# Patient Record
Sex: Female | Born: 1960 | Race: White | Hispanic: No | Marital: Married | Smoking: Current some day smoker
Health system: Southern US, Community
[De-identification: ages and names within clinical notes are randomized; demographics above are authoritative.]

## PROBLEM LIST (undated history)

## (undated) DIAGNOSIS — E079 Disorder of thyroid, unspecified: Secondary | ICD-10-CM

## (undated) DIAGNOSIS — K219 Gastro-esophageal reflux disease without esophagitis: Secondary | ICD-10-CM

## (undated) DIAGNOSIS — E039 Hypothyroidism, unspecified: Secondary | ICD-10-CM

## (undated) DIAGNOSIS — R011 Cardiac murmur, unspecified: Secondary | ICD-10-CM

## (undated) DIAGNOSIS — R519 Headache, unspecified: Secondary | ICD-10-CM

## (undated) DIAGNOSIS — M810 Age-related osteoporosis without current pathological fracture: Secondary | ICD-10-CM

## (undated) DIAGNOSIS — K5790 Diverticulosis of intestine, part unspecified, without perforation or abscess without bleeding: Secondary | ICD-10-CM

## (undated) DIAGNOSIS — Z5189 Encounter for other specified aftercare: Secondary | ICD-10-CM

## (undated) DIAGNOSIS — R51 Headache: Secondary | ICD-10-CM

## (undated) HISTORY — PX: BREAST SURGERY: SHX581

## (undated) HISTORY — DX: Diverticulosis of intestine, part unspecified, without perforation or abscess without bleeding: K57.90

## (undated) HISTORY — DX: Age-related osteoporosis without current pathological fracture: M81.0

## (undated) HISTORY — DX: Disorder of thyroid, unspecified: E07.9

## (undated) HISTORY — PX: COLON SURGERY: SHX602

## (undated) HISTORY — PX: ABDOMINAL HYSTERECTOMY: SHX81

## (undated) HISTORY — DX: Encounter for other specified aftercare: Z51.89

## (undated) HISTORY — PX: SMALL INTESTINE SURGERY: SHX150

## (undated) HISTORY — PX: HERNIA REPAIR: SHX51

---

## 2010-05-25 HISTORY — PX: COLONOSCOPY: SHX174

## 2011-03-16 ENCOUNTER — Ambulatory Visit: Payer: Self-pay | Admitting: Internal Medicine

## 2012-04-01 ENCOUNTER — Ambulatory Visit (INDEPENDENT_AMBULATORY_CARE_PROVIDER_SITE_OTHER): Payer: BC Managed Care – PPO | Admitting: Obstetrics & Gynecology

## 2012-04-01 ENCOUNTER — Encounter: Payer: Self-pay | Admitting: Obstetrics & Gynecology

## 2012-04-01 VITALS — BP 135/76 | HR 87 | Ht 64.0 in | Wt 174.0 lb

## 2012-04-01 DIAGNOSIS — Z1151 Encounter for screening for human papillomavirus (HPV): Secondary | ICD-10-CM

## 2012-04-01 DIAGNOSIS — Z0000 Encounter for general adult medical examination without abnormal findings: Secondary | ICD-10-CM

## 2012-04-01 DIAGNOSIS — Z124 Encounter for screening for malignant neoplasm of cervix: Secondary | ICD-10-CM

## 2012-04-01 DIAGNOSIS — Z01419 Encounter for gynecological examination (general) (routine) without abnormal findings: Secondary | ICD-10-CM

## 2012-04-01 NOTE — Progress Notes (Signed)
Subjective:    [name] is a 51 y.o. female who presents for an annual exam. The patient has no complaints today. She does report rare sex due to dyspareunia related to vaginal atrophy. The patient is sexually active. GYN screening history: last pap: was normal. The patient wears seatbelts: yes. The patient participates in regular exercise: no. Has the patient ever been transfused or tattooed?: no. The patient reports that there is not domestic violence in her life.   Menstrual History: OB History    Grav Para Term Preterm Abortions TAB SAB Ect Mult Living   2 2 2       2       Menarche age: 89 No LMP recorded. Patient is postmenopausal.    The following portions of the patient's history were reviewed and updated as appropriate: allergies, current medications, past family history, past medical history, past social history, past surgical history and problem list.  Review of Systems A comprehensive review of systems was negative. She has been married for 31 years, menopausal since 51 yo, has her mammogram next week, colonoscopy is UTD, she refuses a flu shot today (ever)   Objective:    BP 135/76  Pulse 87  Ht 5\' 4"  (1.626 m)  Wt 174 lb (78.926 kg)  BMI 29.87 kg/m2  General Appearance:    Alert, cooperative, no distress, appears stated age  Head:    Normocephalic, without obvious abnormality, atraumatic  Eyes:    PERRL, conjunctiva/corneas clear, EOM's intact, fundi    benign, both eyes  Ears:    Normal TM's and external ear canals, both ears  Nose:   Nares normal, septum midline, mucosa normal, no drainage    or sinus tenderness  Throat:   Lips, mucosa, and tongue normal; teeth and gums normal  Neck:   Supple, symmetrical, trachea midline, no adenopathy;    thyroid:  no enlargement/tenderness/nodules; no carotid   bruit or JVD  Back:     Symmetric, no curvature, ROM normal, no CVA tenderness  Lungs:     Clear to auscultation bilaterally, respirations unlabored  Chest  Wall:    No tenderness or deformity   Heart:    Regular rate and rhythm, S1 and S2 normal, no murmur, rub   or gallop  Breast Exam:    No tenderness, masses, or nipple abnormality  Abdomen:     Soft, non-tender, bowel sounds active all four quadrants,    no masses, no organomegaly  Genitalia:    Normal female without lesion, discharge or tenderness, severe atrophy, 2nd degree cystocele and uterine prolapse, NSSA, NT, mobile, no adnexal masses or tenderness     Extremities:   Extremities normal, atraumatic, no cyanosis or edema  Pulses:   2+ and symmetric all extremities  Skin:   Skin color, texture, turgor normal, no rashes or lesions  Lymph nodes:   Cervical, supraclavicular, and axillary nodes normal  Neurologic:   CNII-XII intact, normal strength, sensation and reflexes    throughout  .    Assessment:    Healthy female exam.    Plan:     Breast self exam technique reviewed and patient encouraged to perform self-exam monthly. Thin prep Pap smear.

## 2012-04-16 ENCOUNTER — Encounter: Payer: Self-pay | Admitting: Obstetrics & Gynecology

## 2012-07-29 DIAGNOSIS — R351 Nocturia: Secondary | ICD-10-CM | POA: Insufficient documentation

## 2012-07-29 DIAGNOSIS — Z87440 Personal history of urinary (tract) infections: Secondary | ICD-10-CM | POA: Insufficient documentation

## 2012-08-19 DIAGNOSIS — R3129 Other microscopic hematuria: Secondary | ICD-10-CM | POA: Insufficient documentation

## 2014-04-19 ENCOUNTER — Encounter: Payer: Self-pay | Admitting: Obstetrics & Gynecology

## 2016-02-16 DIAGNOSIS — S86912A Strain of unspecified muscle(s) and tendon(s) at lower leg level, left leg, initial encounter: Secondary | ICD-10-CM | POA: Insufficient documentation

## 2016-02-16 DIAGNOSIS — M222X2 Patellofemoral disorders, left knee: Secondary | ICD-10-CM | POA: Insufficient documentation

## 2016-02-24 DIAGNOSIS — M2550 Pain in unspecified joint: Secondary | ICD-10-CM | POA: Insufficient documentation

## 2016-02-24 DIAGNOSIS — R519 Headache, unspecified: Secondary | ICD-10-CM | POA: Insufficient documentation

## 2016-02-24 DIAGNOSIS — R51 Headache: Secondary | ICD-10-CM

## 2016-02-24 DIAGNOSIS — E060 Acute thyroiditis: Secondary | ICD-10-CM | POA: Insufficient documentation

## 2016-02-24 DIAGNOSIS — K219 Gastro-esophageal reflux disease without esophagitis: Secondary | ICD-10-CM | POA: Insufficient documentation

## 2016-02-24 DIAGNOSIS — L409 Psoriasis, unspecified: Secondary | ICD-10-CM | POA: Insufficient documentation

## 2016-08-07 ENCOUNTER — Telehealth: Payer: Self-pay | Admitting: Gastroenterology

## 2016-08-07 NOTE — Telephone Encounter (Signed)
Left voice message for patient to call and schedule appointment with GI for probable abscess formation at the rectosigmoid junction, difficulty passing bowel movements

## 2016-08-27 ENCOUNTER — Ambulatory Visit (INDEPENDENT_AMBULATORY_CARE_PROVIDER_SITE_OTHER): Payer: BC Managed Care – PPO | Admitting: Gastroenterology

## 2016-08-27 ENCOUNTER — Encounter: Payer: Self-pay | Admitting: Gastroenterology

## 2016-08-27 VITALS — BP 128/72 | Ht 64.0 in | Wt 149.4 lb

## 2016-08-27 DIAGNOSIS — E039 Hypothyroidism, unspecified: Secondary | ICD-10-CM | POA: Insufficient documentation

## 2016-08-27 DIAGNOSIS — R194 Change in bowel habit: Secondary | ICD-10-CM

## 2016-08-27 DIAGNOSIS — M810 Age-related osteoporosis without current pathological fracture: Secondary | ICD-10-CM | POA: Insufficient documentation

## 2016-08-27 NOTE — Progress Notes (Signed)
Gastroenterology Consultation  Referring Provider:     No ref. provider found Primary Care Physician:  Imelda Pillow, NP Primary Gastroenterologist:  Dr. Wyline Mood  Reason for Consultation:     "rectal abscess"        HPI:   [name] is a 56 y.o. y/o female referred for consultation & management  by Dr. Imelda Pillow, NP.    She says she had an CT scan of the abdomen at [facility] on 07/30/16. She says developed left lower abdominal pain  In 04/2015 , subsequently she had watery , yellowish stool, not fully formed, nausea, throw up. She says since 2016 lost 20 lbs. She says that she was initially told she has diverticulitis. She has a history of recurrent UTI's. Right before x mass, had pain during defecation . Says at that time she had a "lot of pressure in her rectum", unable to go ,was taking Amitiza for constipation which she took as needed.  Subsequently she had a CT scan .   Presently she says that the last 1 week has "normal bowel movements" , every day which is odd for her. Does not have the "pressure", When she has a meal has the "pressure".   Her last colonoscopy was 6 years back and recalls she had diverticula. Otherwise she recalls was "normal". No family history of colon cancer.   Denies any fevers. Last course of antibiotics "been a while"    BP 128/72   Ht 5\' 4"  (1.626 m)   Wt 149 lb 6.4 oz (67.8 kg)   BMI 25.64 kg/m    Past Medical History:  Diagnosis Date  . Osteoporosis    borderline  . Thyroid disease    hypothyriod    Past Surgical History:  Procedure Laterality Date  . BREAST SURGERY     sterotatic biopsy on right breast.    Prior to Admission medications   Medication Sig Start Date End Date Taking? Authorizing Provider  ARMOUR THYROID 30 MG tablet  03/12/12   Historical Provider, MD  Cholecalciferol (VITAMIN D3) 2000 UNITS TABS Take 2,000 Units by mouth 2 (two) times daily.    Historical Provider, MD  clobetasol ointment  (TEMOVATE) 0.05 %  02/21/12   Historical Provider, MD    Family History  Problem Relation Age of Onset  . Heart disease Father     congestive heart failure  . Hypothyroidism Sister   . Arthritis Sister     RA  . Diabetes Maternal Grandmother   . Hypothyroidism Maternal Grandmother   . Alzheimer's disease Paternal Grandmother   . Hypothyroidism Sister      Social History  Substance Use Topics  . Smoking status: Current Every Day Smoker    Types: Cigarettes  . Smokeless tobacco: Not on file     Comment: patient not thinking about it .  Marland Kitchen Alcohol use No    Allergies as of 08/27/2016  . (No Known Allergies)    Review of Systems:    All systems reviewed and negative except where noted in HPI.   Physical Exam:  There were no vitals taken for this visit. No LMP recorded. Patient is postmenopausal. Psych:  Alert and cooperative. Normal mood and affect. General:   Alert,  Well-developed, well-nourished, pleasant and cooperative in NAD Head:  Normocephalic and atraumatic. Eyes:  Sclera clear, no icterus.   Conjunctiva pink. Ears:  Normal auditory acuity. Nose:  No deformity, discharge, or lesions. Mouth:  No deformity or lesions,oropharynx pink &  moist. Neck:  Supple; no masses or thyromegaly. Lungs:  Respirations even and unlabored.  Clear throughout to auscultation.   No wheezes, crackles, or rhonchi. No acute distress. Heart:  Regular rate and rhythm; no murmurs, clicks, rubs, or gallops. Abdomen:  Normal bowel sounds.  No bruits.  Soft, non-tender and non-distended without masses, hepatosplenomegaly or hernias noted.  No guarding or rebound tenderness.    Msk:  Symmetrical without gross deformities. Good, equal movement & strength bilaterally. Extremities:  No clubbing or edema.  No cyanosis. Neurologic:  Alert and oriented x3;  grossly normal neurologically. Psych:  Alert and cooperative. Normal mood and affect.  Imaging Studies: No results found.  Assessment and Plan:     [name] is a 56 y.o. y/o female has been referred for "rectal abscess". I do not have the results of her Ct scan , by her description likely a diverticular abscess. Also recent change in bowel habits.    Plan   1. Please obtain results of recent CT scan , based on results of CT scan will time her colonoscopy to evaluate change in bowel habits.  2. Depending on timing  3. Advised to stop smoking .   Follow up in 8-10 weeks   Dr Wyline MoodKiran Mckinley Olheiser MD

## 2016-09-04 ENCOUNTER — Other Ambulatory Visit: Payer: Self-pay

## 2016-09-04 DIAGNOSIS — R1032 Left lower quadrant pain: Secondary | ICD-10-CM

## 2016-09-12 ENCOUNTER — Telehealth: Payer: Self-pay | Admitting: Gastroenterology

## 2016-09-12 NOTE — Telephone Encounter (Signed)
Patient called wanting results on abd ct.

## 2016-09-13 ENCOUNTER — Telehealth: Payer: Self-pay

## 2016-09-13 ENCOUNTER — Other Ambulatory Visit: Payer: Self-pay

## 2016-09-13 DIAGNOSIS — Z1211 Encounter for screening for malignant neoplasm of colon: Secondary | ICD-10-CM

## 2016-09-13 NOTE — Telephone Encounter (Signed)
LVM for patient callback.   Received results from [facility].   Waiting for Dr. Johnney KillianAnna's response.

## 2016-09-13 NOTE — Telephone Encounter (Signed)
Gastroenterology Pre-Procedure Review  Request Date: 09/25/16 Requesting Physician: Dr. Tobi BastosAnna  PATIENT REVIEW QUESTIONS: The patient responded to the following health history questions as indicated:    1. Are you having any GI issues? no 2. Do you have a personal history of Polyps? nO 3. Do you have a family history of Colon Cancer or Polyps? no 4. Diabetes Mellitus? no 5. Joint replacements in the past 12 months?no 6. Major health problems in the past 3 months?no 7. Any artificial heart valves, MVP, or defibrillator?no    MEDICATIONS & ALLERGIES:    Patient reports the following regarding taking any anticoagulation/antiplatelet therapy:   Plavix, Coumadin, Eliquis, Xarelto, Lovenox, Pradaxa, Brilinta, or Effient? no Aspirin? no  Patient confirms/reports the following medications:  Current Outpatient Prescriptions  Medication Sig Dispense Refill  . alendronate (FOSAMAX) 70 MG tablet Take by mouth.    Mack Guise. ARMOUR THYROID 30 MG tablet     . Cholecalciferol (VITAMIN D3) 2000 UNITS TABS Take 2,000 Units by mouth 2 (two) times daily.    . clobetasol ointment (TEMOVATE) 0.05 %     . conjugated estrogens (PREMARIN) vaginal cream Apply pea-sized amount to external vaginal tissues 2-3 times weekly.    Marland Kitchen. lubiprostone (AMITIZA) 8 MCG capsule Take by mouth.    . Omega-3 Fatty Acids (FISH OIL PO) Take by mouth.    Marland Kitchen. omeprazole (PRILOSEC OTC) 20 MG tablet Take by mouth.     No current facility-administered medications for this visit.     Patient confirms/reports the following allergies:  No Known Allergies  No orders of the defined types were placed in this encounter.   AUTHORIZATION INFORMATION Primary Insurance: 1D#: Group #:  Secondary Insurance: 1D#: Group #:  SCHEDULE INFORMATION: Date: 09/25/16 Time: Location: [facility]

## 2016-09-19 ENCOUNTER — Telehealth: Payer: Self-pay | Admitting: Gastroenterology

## 2016-09-19 NOTE — Telephone Encounter (Signed)
Patient called to cancel her colonoscopy on 4/10. I called [facility] to cancel. Patient will call back and she stated she just couldn't come at this time

## 2016-09-25 ENCOUNTER — Ambulatory Visit
Admission: RE | Admit: 2016-09-25 | Payer: BC Managed Care – PPO | Source: Ambulatory Visit | Admitting: Gastroenterology

## 2016-09-25 ENCOUNTER — Encounter: Admission: RE | Payer: Self-pay | Source: Ambulatory Visit

## 2016-09-25 SURGERY — COLONOSCOPY WITH PROPOFOL
Anesthesia: General

## 2016-10-01 ENCOUNTER — Encounter: Payer: Self-pay | Admitting: *Deleted

## 2016-10-02 ENCOUNTER — Ambulatory Visit (INDEPENDENT_AMBULATORY_CARE_PROVIDER_SITE_OTHER): Payer: BC Managed Care – PPO | Admitting: General Surgery

## 2016-10-02 ENCOUNTER — Encounter: Payer: Self-pay | Admitting: General Surgery

## 2016-10-02 VITALS — BP 134/84 | HR 70 | Resp 12 | Ht 64.0 in | Wt 142.0 lb

## 2016-10-02 DIAGNOSIS — R634 Abnormal weight loss: Secondary | ICD-10-CM | POA: Insufficient documentation

## 2016-10-02 DIAGNOSIS — Z8719 Personal history of other diseases of the digestive system: Secondary | ICD-10-CM | POA: Insufficient documentation

## 2016-10-02 DIAGNOSIS — N739 Female pelvic inflammatory disease, unspecified: Secondary | ICD-10-CM | POA: Diagnosis not present

## 2016-10-02 LAB — POC HEMOCCULT BLD/STL (OFFICE/1-CARD/DIAGNOSTIC)
Card #1 Date: NEGATIVE
Fecal Occult Blood, POC: NEGATIVE

## 2016-10-02 NOTE — Patient Instructions (Addendum)
The patient is aware to call back for any questions or concerns.  office rigid sigmoidoscopy with fleets enema prior.

## 2016-10-02 NOTE — Progress Notes (Addendum)
Patient ID: [name], female   DOB: 1960-12-25, 56 y.o.   MRN: [id number]  Chief Complaint  Patient presents with  . Other    possible abscess    HPI [name] is a 56 y.o. female.  Here today for evaluation of a possible abscess. She states that 2 years ago(Oct 2016) she started having left lower abdomen pain and was having mucous watery diarrhea. Lasting 3-4 days. She states that by the end of the day the stool would change from mucous to a little blood tinged yellow stool. These episodes would come and go and be associated with a UTI. She states that since December it flared up and has not went away. When she would have the pain she would be nauseated and vomit on occasion. Admits to chills but no fevers. Not associated with any particular foods. Last UTI was October. She has not had a normal BM since December. She never feels like she completely empties her bowels with pressure. She did see Dr Tobi Bastos. She did not have a colonoscopy due to "fear" that the abscess might rupture. Marland Kitchen Her weight in October 2016 was 177 today weight 142. She is here with her husband, [name].   HPI  Past Medical History:  Diagnosis Date  . Diverticulosis   . Osteoporosis    borderline  . Thyroid disease    hypothyriod    Past Surgical History:  Procedure Laterality Date  . BREAST SURGERY     sterotatic biopsy on right breast.    Family History  Problem Relation Age of Onset  . Heart disease Father     congestive heart failure  . Hypothyroidism Sister   . Arthritis Sister     RA  . Diabetes Maternal Grandmother   . Hypothyroidism Maternal Grandmother   . Alzheimer's disease Paternal Grandmother   . Hypothyroidism Sister     Social History Social History  Substance Use Topics  . Smoking status: Current Every Day Smoker    Packs/day: 0.25    Types: Cigarettes  . Smokeless tobacco: Never Used     Comment: patient not thinking about it .  Marland Kitchen Alcohol use No    No  Known Allergies  Current Outpatient Prescriptions  Medication Sig Dispense Refill  . alendronate (FOSAMAX) 70 MG tablet Take by mouth.    Mack Guise THYROID 30 MG tablet     . Calcium Carbonate-Vit D-Min (CALCIUM 1200 PO) Take by mouth daily.    . Cholecalciferol (VITAMIN D3) 2000 UNITS TABS Take 2,000 Units by mouth 2 (two) times daily.    . clobetasol ointment (TEMOVATE) 0.05 % Apply 1 application topically as needed.     . lubiprostone (AMITIZA) 8 MCG capsule Take by mouth.    . Omega-3 Fatty Acids (FISH OIL PO) Take by mouth.    Marland Kitchen omeprazole (PRILOSEC OTC) 20 MG tablet Take by mouth as needed.     . sucralfate (CARAFATE) 1 g tablet Take 1 g by mouth as needed.    . vitamin E 100 UNIT capsule Take 100 Units by mouth daily.     No current facility-administered medications for this visit.     Review of Systems Review of Systems  Constitutional: Positive for chills.  Respiratory: Negative.   Cardiovascular: Negative.   Gastrointestinal: Positive for abdominal pain, diarrhea, nausea and vomiting.    Blood pressure 134/84, pulse 70, resp. rate 12, height  (1.626 m), weight 142 lb (64.4 kg).  Physical Exam Physical  Exam  Constitutional: She is oriented to person, place, and time. She appears well-developed and well-nourished.  HENT:  Mouth/Throat: Oropharynx is clear and moist.  Eyes: Conjunctivae are normal. No scleral icterus.  Neck: Neck supple.  Cardiovascular: Normal rate, regular rhythm and normal heart sounds.   Pulses:      Femoral pulses are 2+ on the right side, and 2+ on the left side.      Dorsalis pedis pulses are 2+ on the right side, and 2+ on the left side.       Posterior tibial pulses are 2+ on the right side, and 2+ on the left side.  No lower leg edema  Pulmonary/Chest: Effort normal and breath sounds normal.  Abdominal: Soft. Normal appearance and bowel sounds are normal. There is no tenderness.  Genitourinary: Rectal exam shows mass. Rectal exam shows  guaiac negative stool.  Genitourinary Comments: On the anterior rectal wall and about 6 cm there is a firm mass without fluctuance noted to palpation. There is tenderness in this area. No posterior tenderness. Normal sphincter tone.  Lymphadenopathy:    She has no cervical adenopathy.  Neurological: She is alert and oriented to person, place, and time.  Skin: Skin is warm and dry.  Psychiatric: Her behavior is normal.    Data Reviewed CT scan of the abdomen and pelvis dated 07/30/2016 completed at [facility] was personally reviewed. Normal appendix. No evidence of inflammatory bowel disease. Likely developing abscess anterior to the rectosigmoid junction measuring up to 2.2 cm. No evidence of acute diverticulitis. This study was completed with both IV and oral contrast. A hyperemic rim around the anterior rectal wall abscess is noted.  CT scan of the abdomen and pelvis completed with oral contrast only on 09/06/2016 at [facility] in [place] was personally reviewed. Shotty mesenteric nodes. No acute process noted. Mild diverticulosis. Fecal retention. No area of definite abscess was noted on this study completed without IV contrast.  CT scan of the abdomen and pelvis completed at [facility] dated 09/27/2016 was personally reviewed. The previously noted cul-de-sac abscess in the anterior pararectal lesion has increased from 2.2cm to 4.8 cm in diameter.  GI consultation of August 27, 2016 completed by Wyline Mood, M.D reviewed. Benign exam. Colonoscopy recommended. Repeat CT scan arranged for 09/06/2016.  Previous laboratory studies not available for review at this time.  Assessment    Enlarging anterior rectal abscess. Abnormal rectal exam. Significant weight loss. Blood/mucus in the stools by history.    Plan    At her age the most common process would be a diverticular abscess although its unusual be identified in the cul-de-sac without more  pronounced abdominal symptoms.  Other concerns could be a primary rectal pathology with focal perforation.  The patient certainly has an enlarging abscess and she will likely benefit from CT-guided drainage.  Arrangements have been made for a sigmoidoscopy tomorrow to assess the rectal mucosa for the possibility of a primary rectal malignancy. If this is negative, arrangements were made for observation, CT-guided drainage with overnight observation.     Guaiac negative today. Recommend office rigid sigmoidoscopy with fleets enema prior. Then possible CT drainage abscess.   HPI, Physical Exam, Assessment and Plan have been scribed under the direction and in the presence of [name], MD.  Dorathy Daft, RN  I have completed the exam and reviewed the above documentation for accuracy and completeness.  I agree with the above.  Museum/gallery conservator has been used  and any errors in dictation or transcription are unintentional.  Donnalee Curry, M.D., F.A.C.S.   [name] 10/02/2016, 9:27 PM

## 2016-10-03 ENCOUNTER — Encounter: Payer: Self-pay | Admitting: General Surgery

## 2016-10-03 ENCOUNTER — Other Ambulatory Visit: Payer: Self-pay | Admitting: General Surgery

## 2016-10-03 ENCOUNTER — Ambulatory Visit (INDEPENDENT_AMBULATORY_CARE_PROVIDER_SITE_OTHER): Payer: BC Managed Care – PPO | Admitting: General Surgery

## 2016-10-03 ENCOUNTER — Ambulatory Visit
Admission: RE | Admit: 2016-10-03 | Discharge: 2016-10-03 | Disposition: A | Discharge status: Home / Self Care | Payer: Self-pay | Source: Ambulatory Visit | Attending: General Surgery | Admitting: General Surgery

## 2016-10-03 ENCOUNTER — Other Ambulatory Visit (HOSPITAL_COMMUNITY): Payer: Self-pay | Admitting: Occupational Therapy

## 2016-10-03 VITALS — BP 130/76 | HR 80 | Resp 12 | Ht 64.0 in | Wt 141.0 lb

## 2016-10-03 DIAGNOSIS — K5780 Diverticulitis of intestine, part unspecified, with perforation and abscess without bleeding: Secondary | ICD-10-CM | POA: Diagnosis not present

## 2016-10-03 DIAGNOSIS — K5792 Diverticulitis of intestine, part unspecified, without perforation or abscess without bleeding: Secondary | ICD-10-CM

## 2016-10-03 DIAGNOSIS — N739 Female pelvic inflammatory disease, unspecified: Secondary | ICD-10-CM

## 2016-10-03 MED ORDER — ERTAPENEM SODIUM 1 G IJ SOLR: 1.0000 g | Freq: Once | INTRAMUSCULAR | Status: DC

## 2016-10-03 NOTE — Progress Notes (Signed)
Patient ID: [name], female   DOB: 08-May-1961, 56 y.o.   MRN: [id number]  Chief Complaint  Patient presents with  . Procedure    rigid sigmoid    HPI [name] is a 56 y.o. female here today for a rigid sigmoid.  HPI  Past Medical History:  Diagnosis Date  . Diverticulosis   . Osteoporosis    borderline  . Thyroid disease    hypothyriod    Past Surgical History:  Procedure Laterality Date  . BREAST SURGERY     sterotatic biopsy on right breast.    Family History  Problem Relation Age of Onset  . Heart disease Father     congestive heart failure  . Hypothyroidism Sister   . Arthritis Sister     RA  . Diabetes Maternal Grandmother   . Hypothyroidism Maternal Grandmother   . Alzheimer's disease Paternal Grandmother   . Hypothyroidism Sister     Social History Social History  Substance Use Topics  . Smoking status: Current Every Day Smoker    Packs/day: 0.25    Types: Cigarettes  . Smokeless tobacco: Never Used     Comment: patient not thinking about it .  Marland Kitchen Alcohol use No    No Known Allergies  Current Outpatient Prescriptions  Medication Sig Dispense Refill  . alendronate (FOSAMAX) 70 MG tablet Take by mouth.    Mack Guise THYROID 30 MG tablet     . Calcium Carbonate-Vit D-Min (CALCIUM 1200 PO) Take by mouth daily.    . Cholecalciferol (VITAMIN D3) 2000 UNITS TABS Take 2,000 Units by mouth 2 (two) times daily.    . clobetasol ointment (TEMOVATE) 0.05 % Apply 1 application topically as needed.     . lubiprostone (AMITIZA) 8 MCG capsule Take by mouth.    . Omega-3 Fatty Acids (FISH OIL PO) Take by mouth.    Marland Kitchen omeprazole (PRILOSEC OTC) 20 MG tablet Take by mouth as needed.     . sucralfate (CARAFATE) 1 g tablet Take 1 g by mouth as needed.    . vitamin E 100 UNIT capsule Take 100 Units by mouth daily.     No current facility-administered medications for this visit.     Review of Systems Review of Systems  Constitutional: Negative.    Respiratory: Negative.   Cardiovascular: Negative.     Blood pressure 130/76, pulse 80, resp. rate 12, height  (1.626 m), weight 141 lb (64 kg).  Physical Exam Physical Exam Digital rectal exam was repeated and again showed granular texture at about 7 cm on the anterior rectal wall.  Data Reviewed Rigid sigmoidoscopy was completed to 12 cm. The mucosa is normal without evidence of malignancy.  Assessment    Low pelvic abscess, likely diverticular source.    Plan    Arrangements are in place for direct admission tomorrow for planned interventional radiology drainage of the abscess. The procedure has been reviewed with the patient and her husband. We'll plan for overnight observation for antibiotic administrations anticipating a transient bacteremia after abscess drainage.      I have completed the exam and reviewed the above documentation for accuracy and completeness.  I agree with the above.  Museum/gallery conservator has been used and any errors in dictation or transcription are unintentional.  Donnalee Curry, M.D., F.A.C.S.   [name] 10/03/2016, 3:54 PM

## 2016-10-04 ENCOUNTER — Observation Stay: Payer: BC Managed Care – PPO

## 2016-10-04 ENCOUNTER — Encounter: Payer: Self-pay | Admitting: *Deleted

## 2016-10-04 ENCOUNTER — Observation Stay
Admission: AD | Admit: 2016-10-04 | Discharge: 2016-10-04 | Disposition: A | Discharge status: Home / Self Care | Payer: BC Managed Care – PPO | Source: Ambulatory Visit | Attending: General Surgery | Admitting: General Surgery

## 2016-10-04 DIAGNOSIS — E039 Hypothyroidism, unspecified: Secondary | ICD-10-CM | POA: Diagnosis not present

## 2016-10-04 DIAGNOSIS — F17210 Nicotine dependence, cigarettes, uncomplicated: Secondary | ICD-10-CM | POA: Insufficient documentation

## 2016-10-04 DIAGNOSIS — N739 Female pelvic inflammatory disease, unspecified: Principal | ICD-10-CM | POA: Insufficient documentation

## 2016-10-04 DIAGNOSIS — Z7983 Long term (current) use of bisphosphonates: Secondary | ICD-10-CM | POA: Diagnosis not present

## 2016-10-04 DIAGNOSIS — Z79899 Other long term (current) drug therapy: Secondary | ICD-10-CM | POA: Insufficient documentation

## 2016-10-04 DIAGNOSIS — Z539 Procedure and treatment not carried out, unspecified reason: Secondary | ICD-10-CM | POA: Insufficient documentation

## 2016-10-04 DIAGNOSIS — L0291 Cutaneous abscess, unspecified: Secondary | ICD-10-CM

## 2016-10-04 LAB — APTT: aPTT: 37 seconds — ABNORMAL HIGH (ref 24–36)

## 2016-10-04 LAB — CBC
HCT: 37.2 % (ref 35.0–47.0)
Hemoglobin: 12.7 g/dL (ref 12.0–16.0)
MCH: 29.1 pg (ref 26.0–34.0)
MCHC: 34.1 g/dL (ref 32.0–36.0)
MCV: 85.2 fL (ref 80.0–100.0)
PLATELETS: 424 10*3/uL (ref 150–440)
RBC: 4.37 MIL/uL (ref 3.80–5.20)
RDW: 14.9 % — ABNORMAL HIGH (ref 11.5–14.5)
WBC: 10.7 10*3/uL (ref 3.6–11.0)

## 2016-10-04 LAB — PROTIME-INR
INR: 1.02
Prothrombin Time: 13.4 seconds (ref 11.4–15.2)

## 2016-10-04 MED ORDER — SODIUM CHLORIDE 0.9 % IV SOLN
INTRAVENOUS | Status: DC
  Administered 2016-10-04: 11:00:00 via INTRAVENOUS

## 2016-10-04 MED ORDER — FENTANYL CITRATE (PF) 100 MCG/2ML IJ SOLN
INTRAMUSCULAR | Status: AC
  Filled 2016-10-04: qty 4

## 2016-10-04 MED ORDER — IOPAMIDOL (ISOVUE-300) INJECTION 61%
100.0000 mL | Freq: Once | INTRAVENOUS | Status: AC | PRN
  Administered 2016-10-04: 100 mL via INTRAVENOUS

## 2016-10-04 MED ORDER — AMOXICILLIN-POT CLAVULANATE 875-125 MG PO TABS: 1.0000 | ORAL_TABLET | Freq: Two times a day (BID) | ORAL | 0 refills | Status: DC

## 2016-10-04 MED ORDER — MIDAZOLAM HCL 5 MG/5ML IJ SOLN
INTRAMUSCULAR | Status: AC
  Filled 2016-10-04: qty 5

## 2016-10-04 NOTE — Progress Notes (Signed)
Patient ID: [name], female   DOB: 1960/08/26, 56 y.o.   MRN: [id number] CT images of the pelvis obtained in anticipation of a CT-guided aspiration or drainage. The pelvic fluid collection was not well seen on the noncontrast images. Therefore, postcontrast images of the pelvis were obtained. The pelvic fluid collection has markedly decreased in size compared to the previous CT from one week ago. As a result, the CT-guided drainage procedure was canceled. Findings discussed with Dr.Byrnett.

## 2016-10-04 NOTE — Progress Notes (Signed)
Discharge   Pt was able to participate in discharge teaching. PIV removed and pt has no complaints of pain at this time. Pt refused wheelchair and requested to walk out. Pt reminded of pharmacy her prescriptions are called in to, its schedule and that the MD office will call with the follow-up appt.

## 2016-10-04 NOTE — Consult Note (Signed)
Chief Complaint: Patient was seen in consultation today for a pelvic abscess drainage at the request of Dr. Lemar Livings  Referring Physician(s): Dr. Donnalee Curry  Patient Status: [facility] - In-pt  History of Present Illness: [name] is a 56 y.o. female with an enlarging pelvic fluid collection that is concerning for a pelvic abscess. She has been feeling pelvic pressure for quite a while now. Patient describes a deep pressure along the left anal region. She is aware of the pressure but it is not severe pain. She has been reluctant to have a colonoscopy due to fear of a complication related to the pelvic pressure. Patient has had 3 CT scans of the abdomen and pelvis since 07/30/2016. There is a progressively enlarging fluid collection in the pelvis between the rectum and the uterus. There may be some fluid tracking along the sigmoid colon. Mild inflammatory changes in the pelvis. Patient has been admitted for CT-guided aspiration of this pelvic fluid collection and possible drain placement.  Past Medical History:  Diagnosis Date  . Diverticulosis   . Osteoporosis    borderline  . Thyroid disease    hypothyriod    Past Surgical History:  Procedure Laterality Date  . BREAST SURGERY     sterotatic biopsy on right breast.    Allergies: Patient has no known allergies.  Medications: Prior to Admission medications   Medication Sig Start Date End Date Taking? Authorizing Provider  alendronate (FOSAMAX) 70 MG tablet Take by mouth. 01/27/16   Historical Provider, MD  ARMOUR THYROID 30 MG tablet  03/12/12   Historical Provider, MD  Calcium Carbonate-Vit D-Min (CALCIUM 1200 PO) Take by mouth daily.    Historical Provider, MD  Cholecalciferol (VITAMIN D3) 2000 UNITS TABS Take 2,000 Units by mouth 2 (two) times daily.    Historical Provider, MD  clobetasol ointment (TEMOVATE) 0.05 % Apply 1 application topically as needed.  02/21/12   Historical Provider, MD  lubiprostone (AMITIZA) 8  MCG capsule Take by mouth.    Historical Provider, MD  Omega-3 Fatty Acids (FISH OIL PO) Take by mouth. 08/09/09   Historical Provider, MD  omeprazole (PRILOSEC OTC) 20 MG tablet Take by mouth as needed.  07/29/12   Historical Provider, MD  sucralfate (CARAFATE) 1 g tablet Take 1 g by mouth as needed.    Historical Provider, MD  vitamin E 100 UNIT capsule Take 100 Units by mouth daily.    Historical Provider, MD     Family History  Problem Relation Age of Onset  . Heart disease Father     congestive heart failure  . Hypothyroidism Sister   . Arthritis Sister     RA  . Diabetes Maternal Grandmother   . Hypothyroidism Maternal Grandmother   . Alzheimer's disease Paternal Grandmother   . Hypothyroidism Sister     Social History   Social History  . Marital status: Married    Spouse name: N/A  . Number of children: N/A  . Years of education: N/A   Social History Main Topics  . Smoking status: Current Every Day Smoker    Packs/day: 0.25    Types: Cigarettes  . Smokeless tobacco: Never Used     Comment: patient not thinking about it .  Marland Kitchen Alcohol use No  . Drug use: No  . Sexual activity: Not Currently    Partners: Male    Birth control/ protection: Post-menopausal   Other Topics Concern  . None   Social History Narrative  . None  Review of Systems: A 12 point ROS discussed and pertinent positives are indicated in the HPI above.  All other systems are negative.  Review of Systems  Constitutional: Negative for chills and diaphoresis.  Respiratory: Negative.   Cardiovascular: Negative.   Gastrointestinal: Positive for abdominal pain.  Genitourinary: Negative.     Vital Signs: BP (!) 120/59 (BP Location: Left Arm)   Pulse 78   Temp 97.9 F (36.6 C) (Oral)   Resp 16   SpO2 100%   Physical Exam  Constitutional: She is oriented to person, place, and time. She appears well-developed and well-nourished.  Cardiovascular: Normal rate, regular rhythm and normal heart  sounds.   Pulmonary/Chest: Effort normal and breath sounds normal.  Abdominal: Soft. Bowel sounds are normal. She exhibits no distension. There is no tenderness. There is no guarding.  Neurological: She is alert and oriented to person, place, and time.    Mallampati Score:  MD Evaluation Airway: WNL Heart: WNL Abdomen: WNL Chest/ Lungs: WNL ASA  Classification: 1 Mallampati/Airway Score: One  Imaging: No results found.  Labs:  CBC:  Recent Labs  10/04/16 0938  WBC 10.7  HGB 12.7  HCT 37.2  PLT 424    COAGS:  Recent Labs  10/04/16 0938  INR 1.02  APTT 37*    BMP: No results for input(s): NA, K, CL, CO2, GLUCOSE, BUN, CALCIUM, CREATININE, GFRNONAA, GFRAA in the last 8760 hours.  Invalid input(s): CMP  LIVER FUNCTION TESTS: No results for input(s): BILITOT, AST, ALT, ALKPHOS, PROT, ALBUMIN in the last 8760 hours.  TUMOR MARKERS: No results for input(s): AFPTM, CEA, CA199, CHROMGRNA in the last 8760 hours.  Assessment and Plan:  56 yo female with a progressively enlarging pelvic fluid collection. Etiology for this fluid collection is uncertain although it could represent an indolent diverticulitis with abscess. CT imaging has been reviewed and there appears to be a percutaneous window from a trans-gluteal approach. Discussed CT-guided fluid collection aspiration and drain placement with the patient in depth. Patient has a good understanding of the procedure and agrees with the procedure. Plan for the CT-guided procedure with moderate sedation.  Thank you for this interesting consult.  I greatly enjoyed meeting [employer] and look forward to participating in their care.  A copy of this report was sent to the requesting provider on this date.  Electronically Signed: Abundio Miu 10/04/2016, 2:19 PM   I spent a total of 20 Minutes    in face to face in clinical consultation, greater than 50% of which was counseling/coordinating care for drainage of  the pelvic fluid collection.

## 2016-10-04 NOTE — Final Progress Note (Signed)
No abscess identified on today's exam. Will d/c home on Augmentin and f/u as outpatient.

## 2016-10-04 NOTE — Sedation Documentation (Signed)
Procedure cancelled.

## 2016-10-04 NOTE — H&P (Signed)
[name] is an 56 y.o. female.   Chief Complaint: Pelvic abscess  HPI: [name] is a 56 y.o. female.  Here today for evaluation of a possible abscess. She states that 2 years ago(Oct 2016) she started having left lower abdomen pain and was having mucous watery diarrhea. Lasting 3-4 days. She states that by the end of the day the stool would change from mucous to a little blood tinged yellow stool. These episodes would come and go and be associated with a UTI. She states that since December it flared up and has not went away. When she would have the pain she would be nauseated and vomit on occasion. Admits to chills but no fevers. Not associated with any particular foods. Last UTI was October. She has not had a normal BM since December. She never feels like she completely empties her bowels with pressure. She did see Dr Tobi Bastos. She did not have a colonoscopy due to "fear" that the abscess might rupture. Marland Kitchen Her weight in October 2016 was 177 today weight 142.  Past Medical History:  Diagnosis Date  . Diverticulosis   . Osteoporosis    borderline  . Thyroid disease    hypothyriod    Past Surgical History:  Procedure Laterality Date  . BREAST SURGERY     sterotatic biopsy on right breast.    Family History  Problem Relation Age of Onset  . Heart disease Father     congestive heart failure  . Hypothyroidism Sister   . Arthritis Sister     RA  . Diabetes Maternal Grandmother   . Hypothyroidism Maternal Grandmother   . Alzheimer's disease Paternal Grandmother   . Hypothyroidism Sister    Social History:  reports that she has been smoking Cigarettes.  She has been smoking about 0.25 packs per day. She has never used smokeless tobacco. She reports that she does not drink alcohol or use drugs.  Allergies: No Known Allergies  Facility-Administered Medications Prior to Admission  Medication Dose Route Frequency Provider Last Rate Last Dose  . ertapenem (INVANZ)  injection 1 g  1 g Intravenous Once [name], MD       Medications Prior to Admission  Medication Sig Dispense Refill  . alendronate (FOSAMAX) 70 MG tablet Take by mouth.    Mack Guise THYROID 30 MG tablet     . Calcium Carbonate-Vit D-Min (CALCIUM 1200 PO) Take by mouth daily.    . Cholecalciferol (VITAMIN D3) 2000 UNITS TABS Take 2,000 Units by mouth 2 (two) times daily.    . clobetasol ointment (TEMOVATE) 0.05 % Apply 1 application topically as needed.     . lubiprostone (AMITIZA) 8 MCG capsule Take by mouth.    . Omega-3 Fatty Acids (FISH OIL PO) Take by mouth.    Marland Kitchen omeprazole (PRILOSEC OTC) 20 MG tablet Take by mouth as needed.     . sucralfate (CARAFATE) 1 g tablet Take 1 g by mouth as needed.    . vitamin E 100 UNIT capsule Take 100 Units by mouth daily.      Results for orders placed or performed during the hospital encounter of 10/04/16 (from the past 48 hour(s))  CBC     Status: Abnormal   Collection Time: 10/04/16  9:38 AM  Result Value Ref Range   WBC 10.7 3.6 - 11.0 K/uL   RBC 4.37 3.80 - 5.20 MIL/uL   Hemoglobin 12.7 12.0 - 16.0 g/dL   HCT 91.4 78.2 - 95.6 %  MCV 85.2 80.0 - 100.0 fL   MCH 29.1 26.0 - 34.0 pg   MCHC 34.1 32.0 - 36.0 g/dL   RDW 16.1 (H) 09.6 - 04.5 %   Platelets 424 150 - 440 K/uL  Protime-INR     Status: None   Collection Time: 10/04/16  9:38 AM  Result Value Ref Range   Prothrombin Time 13.4 11.4 - 15.2 seconds   INR 1.02   APTT     Status: Abnormal   Collection Time: 10/04/16  9:38 AM  Result Value Ref Range   aPTT 37 (H) 24 - 36 seconds    Comment:        IF BASELINE aPTT IS ELEVATED, SUGGEST PATIENT RISK ASSESSMENT BE USED TO DETERMINE APPROPRIATE ANTICOAGULANT THERAPY.    No results found.  ROS  Blood pressure (!) 120/59, pulse 78, temperature 97.9 F (36.6 C), temperature source Oral, resp. rate 16, SpO2 100 %. Physical Exam  Constitutional: She appears well-developed and well-nourished.  HENT:  Head: Normocephalic and  atraumatic.  Eyes: Pupils are equal, round, and reactive to light.  Neck: Neck supple. No thyromegaly present.  Cardiovascular: Normal rate and regular rhythm.   Respiratory: Effort normal and breath sounds normal.  GI: Soft. Bowel sounds are normal.  Genitourinary:  Genitourinary Comments: Anterior rectal wall fullness without fluctuance. Negative sigmoidoscopy.      Assessment/Plan Pelvic abscess on CT.   Plan:  CT guided drainage.   [name], MD 10/04/2016, 1:19 PM

## 2016-10-05 LAB — POCT I-STAT CREATININE: Creatinine, Ser: 0.7 mg/dL (ref 0.44–1.00)

## 2016-10-16 ENCOUNTER — Encounter: Payer: Self-pay | Admitting: General Surgery

## 2016-10-16 ENCOUNTER — Ambulatory Visit (INDEPENDENT_AMBULATORY_CARE_PROVIDER_SITE_OTHER): Payer: BC Managed Care – PPO | Admitting: General Surgery

## 2016-10-16 VITALS — BP 120/80 | HR 87 | Resp 12 | Ht 64.0 in | Wt 141.0 lb

## 2016-10-16 DIAGNOSIS — N739 Female pelvic inflammatory disease, unspecified: Secondary | ICD-10-CM

## 2016-10-16 NOTE — Progress Notes (Addendum)
Patient ID: [name], female   DOB: September 07, 1960, 56 y.o.   MRN: [id number]  Chief Complaint  Patient presents with  . Follow-up    HPI [name] is a 56 y.o. female here today for her follow up abscess of the pelvi. She was admitted on 10/04/2016. Patient states she is doing well and moving her bowels without the pelvic pressure she had previously noted. The patient looks significantly better than she did prior to her hospitalization and reports eating better. Weight is stable. Daughter, [name] is present at visit.  HPI  Past Medical History:  Diagnosis Date  . Diverticulosis   . Osteoporosis    borderline  . Thyroid disease    hypothyriod    Past Surgical History:  Procedure Laterality Date  . BREAST SURGERY     sterotatic biopsy on right breast.  . COLONOSCOPY  05/25/2010    Family History  Problem Relation Age of Onset  . Heart disease Father     congestive heart failure  . Hypothyroidism Sister   . Arthritis Sister     RA  . Diabetes Maternal Grandmother   . Hypothyroidism Maternal Grandmother   . Alzheimer's disease Paternal Grandmother   . Hypothyroidism Sister     Social History Social History  Substance Use Topics  . Smoking status: Current Every Day Smoker    Packs/day: 0.25    Types: Cigarettes  . Smokeless tobacco: Never Used     Comment: patient not thinking about it .  Marland Kitchen Alcohol use No    No Known Allergies  Current Outpatient Prescriptions  Medication Sig Dispense Refill  . alendronate (FOSAMAX) 70 MG tablet Take by mouth.    Mack Guise THYROID 30 MG tablet     . Calcium Carbonate-Vit D-Min (CALCIUM 1200 PO) Take by mouth daily.    . Cholecalciferol (VITAMIN D3) 2000 UNITS TABS Take 2,000 Units by mouth 2 (two) times daily.    . clobetasol ointment (TEMOVATE) 0.05 % Apply 1 application topically as needed.     . lubiprostone (AMITIZA) 8 MCG capsule Take by mouth.    . Omega-3 Fatty Acids (FISH OIL PO) Take by mouth.    Marland Kitchen  omeprazole (PRILOSEC OTC) 20 MG tablet Take by mouth as needed.     . sucralfate (CARAFATE) 1 g tablet Take 1 g by mouth as needed.    . vitamin E 100 UNIT capsule Take 100 Units by mouth daily.     No current facility-administered medications for this visit.     Review of Systems Review of Systems  Constitutional: Negative.   Respiratory: Negative.   Cardiovascular: Negative.     Blood pressure 120/80, pulse 87, resp. rate 12, height  (1.626 m), weight 141 lb (64 kg).  Physical Exam Physical Exam  Constitutional: She is oriented to person, place, and time. She appears well-developed and well-nourished.  Cardiovascular: Normal rate, regular rhythm and normal heart sounds.   Pulmonary/Chest: Effort normal and breath sounds normal.  Abdominal: Soft. Bowel sounds are normal. There is no tenderness.  Genitourinary:     Neurological: She is alert and oriented to person, place, and time.  Skin: Skin is warm and dry.    Data Reviewed CT scan of 10/04/2016 completed in preparation for planned pelvic abscess drainage show the area had significantly decreased in size. Concern for inflammation in the left adnexal/sigmoid area, likely sigmoid thickening. Stranding and edema throughout the presacral/posterior pelvic area.  The patient brought a copy  of her 05/25/2010 screening colonoscopy completed at [facility]. Multiple diverticuli in the sigmoid and descending colon. Otherwise normal exam.  Assessment    Marked improvement in pelvic discomfort with initiation of Augmentin therapy.    Plan    The patient would most likely have suffered an episode of diverticulitis, although the presentation with a pelvic abscess is somewhat atypical. Less likely would be inflammatory bowel disease (no family history) 2.  Previous recommendations for colonoscopy are appropriate and after a suitable "cooling off.".  The patient had previously undergone an outpatient screening exam  with Dr. Marva Panda, but had not seen him since that procedure. She requested not to return to [facility].       Patient to return on six weeks and then scheduled colonoscopy.   The patient is aware to call back for any questions or concerns.  HPI, Physical Exam, Assessment and Plan have been scribed under the direction and in the presence of Donnalee Curry, MD.  Ples Specter, CMA  [name] 10/17/2016, 3:47 PM

## 2016-10-16 NOTE — Patient Instructions (Addendum)
The patient is aware to call back for any questions or concerns. Patient to follow up in six weeks and then will schedule colonoscopy .

## 2016-11-26 ENCOUNTER — Encounter: Payer: Self-pay | Admitting: General Surgery

## 2016-11-26 ENCOUNTER — Ambulatory Visit (INDEPENDENT_AMBULATORY_CARE_PROVIDER_SITE_OTHER): Payer: BC Managed Care – PPO | Admitting: General Surgery

## 2016-11-26 VITALS — BP 118/64 | HR 74 | Resp 12 | Ht 64.0 in | Wt 143.0 lb

## 2016-11-26 DIAGNOSIS — Z8719 Personal history of other diseases of the digestive system: Secondary | ICD-10-CM

## 2016-11-26 DIAGNOSIS — R197 Diarrhea, unspecified: Secondary | ICD-10-CM | POA: Diagnosis not present

## 2016-11-26 MED ORDER — POLYETHYLENE GLYCOL 3350 17 GM/SCOOP PO POWD: ORAL | 0 refills | Status: DC
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
## 2016-11-26 NOTE — Patient Instructions (Signed)
Colonoscopy, Adult A colonoscopy is an exam to look at the entire large intestine. During the exam, a lubricated, bendable tube is inserted into the anus and then passed into the rectum, colon, and other parts of the large intestine. A colonoscopy is often done as a part of normal colorectal screening or in response to certain symptoms, such as anemia, persistent diarrhea, abdominal pain, and blood in the stool. The exam can help screen for and diagnose medical problems, including:  Tumors.  Polyps.  Inflammation.  Areas of bleeding.  Tell a health care provider about:  Any allergies you have.  All medicines you are taking, including vitamins, herbs, eye drops, creams, and over-the-counter medicines.  Any problems you or family members have had with anesthetic medicines.  Any blood disorders you have.  Any surgeries you have had.  Any medical conditions you have.  Any problems you have had passing stool. What are the risks? Generally, this is a safe procedure. However, problems may occur, including:  Bleeding.  A tear in the intestine.  A reaction to medicines given during the exam.  Infection (rare).  What happens before the procedure? Eating and drinking restrictions Follow instructions from your health care provider about eating and drinking, which may include:  A few days before the procedure - follow a low-fiber diet. Avoid nuts, seeds, dried fruit, raw fruits, and vegetables.  1-3 days before the procedure - follow a clear liquid diet. Drink only clear liquids, such as clear broth or bouillon, black coffee or tea, clear juice, clear soft drinks or sports drinks, gelatin dessert, and popsicles. Avoid any liquids that contain red or purple dye.  On the day of the procedure - do not eat or drink anything during the 2 hours before the procedure, or within the time period that your health care provider recommends.  Bowel prep If you were prescribed an oral bowel prep  to clean out your colon:  Take it as told by your health care provider. Starting the day before your procedure, you will need to drink a large amount of medicated liquid. The liquid will cause you to have multiple loose stools until your stool is almost clear or light green.  If your skin or anus gets irritated from diarrhea, you may use these to relieve the irritation: ? Medicated wipes, such as adult wet wipes with aloe and vitamin E. ? A skin soothing-product like petroleum jelly.  If you vomit while drinking the bowel prep, take a break for up to 60 minutes and then begin the bowel prep again. If vomiting continues and you cannot take the bowel prep without vomiting, call your health care provider.  General instructions  Ask your health care provider about changing or stopping your regular medicines. This is especially important if you are taking diabetes medicines or blood thinners.  Plan to have someone take you home from the hospital or clinic. What happens during the procedure?  An IV tube may be inserted into one of your veins.  You will be given medicine to help you relax (sedative).  To reduce your risk of infection: ? Your health care team will wash or sanitize their hands. ? Your anal area will be washed with soap.  You will be asked to lie on your side with your knees bent.  Your health care provider will lubricate a long, thin, flexible tube. The tube will have a camera and a light on the end.  The tube will be inserted into your   anus.  The tube will be gently eased through your rectum and colon.  Air will be delivered into your colon to keep it open. You may feel some pressure or cramping.  The camera will be used to take images during the procedure.  A small tissue sample may be removed from your body to be examined under a microscope (biopsy). If any potential problems are found, the tissue will be sent to a lab for testing.  If small polyps are found, your  health care provider may remove them and have them checked for cancer cells.  The tube that was inserted into your anus will be slowly removed. The procedure may vary among health care providers and hospitals. What happens after the procedure?  Your blood pressure, heart rate, breathing rate, and blood oxygen level will be monitored until the medicines you were given have worn off.  Do not drive for 24 hours after the exam.  You may have a small amount of blood in your stool.  You may pass gas and have mild abdominal cramping or bloating due to the air that was used to inflate your colon during the exam.  It is up to you to get the results of your procedure. Ask your health care provider, or the department performing the procedure, when your results will be ready. This information is not intended to replace advice given to you by your health care provider. Make sure you discuss any questions you have with your health care provider. Document Released: 06/01/2000 Document Revised: 04/04/2016 Document Reviewed: 08/16/2015 Elsevier Interactive Patient Education  2018 Elsevier Inc.  

## 2016-11-28 ENCOUNTER — Ambulatory Visit: Payer: BC Managed Care – PPO | Admitting: General Surgery

## 2016-12-25 ENCOUNTER — Ambulatory Visit: Payer: BC Managed Care – PPO | Admitting: Certified Registered"

## 2016-12-25 ENCOUNTER — Encounter
Admission: RE | Disposition: A | Discharge status: Home / Self Care | Payer: Self-pay | Source: Ambulatory Visit | Attending: General Surgery

## 2016-12-25 ENCOUNTER — Encounter: Payer: Self-pay | Admitting: *Deleted

## 2016-12-25 ENCOUNTER — Ambulatory Visit
Admission: RE | Admit: 2016-12-25 | Discharge: 2016-12-25 | Disposition: A | Discharge status: Home / Self Care | Payer: BC Managed Care – PPO | Source: Ambulatory Visit | Attending: General Surgery | Admitting: General Surgery

## 2016-12-25 ENCOUNTER — Telehealth: Payer: Self-pay | Admitting: *Deleted

## 2016-12-25 ENCOUNTER — Other Ambulatory Visit: Payer: Self-pay

## 2016-12-25 DIAGNOSIS — Z8719 Personal history of other diseases of the digestive system: Secondary | ICD-10-CM | POA: Diagnosis not present

## 2016-12-25 DIAGNOSIS — R933 Abnormal findings on diagnostic imaging of other parts of digestive tract: Secondary | ICD-10-CM | POA: Diagnosis present

## 2016-12-25 DIAGNOSIS — R197 Diarrhea, unspecified: Secondary | ICD-10-CM | POA: Diagnosis not present

## 2016-12-25 DIAGNOSIS — Z79899 Other long term (current) drug therapy: Secondary | ICD-10-CM | POA: Insufficient documentation

## 2016-12-25 DIAGNOSIS — K219 Gastro-esophageal reflux disease without esophagitis: Secondary | ICD-10-CM | POA: Insufficient documentation

## 2016-12-25 DIAGNOSIS — K56699 Other intestinal obstruction unspecified as to partial versus complete obstruction: Secondary | ICD-10-CM

## 2016-12-25 DIAGNOSIS — K56609 Unspecified intestinal obstruction, unspecified as to partial versus complete obstruction: Secondary | ICD-10-CM | POA: Insufficient documentation

## 2016-12-25 DIAGNOSIS — K5140 Inflammatory polyps of colon without complications: Secondary | ICD-10-CM | POA: Diagnosis not present

## 2016-12-25 HISTORY — DX: Gastro-esophageal reflux disease without esophagitis: K21.9

## 2016-12-25 HISTORY — PX: COLONOSCOPY WITH PROPOFOL: SHX5780

## 2016-12-25 SURGERY — COLONOSCOPY WITH PROPOFOL
Anesthesia: General

## 2016-12-25 MED ORDER — SODIUM CHLORIDE 0.9 % IV SOLN
INTRAVENOUS | Status: DC
  Administered 2016-12-25: 15:00:00 via INTRAVENOUS

## 2016-12-25 MED ORDER — LIDOCAINE HCL (CARDIAC) 20 MG/ML IV SOLN
INTRAVENOUS | Status: DC | PRN
  Administered 2016-12-25: 40 mg via INTRAVENOUS

## 2016-12-25 MED ORDER — MIDAZOLAM HCL 2 MG/2ML IJ SOLN
INTRAMUSCULAR | Status: DC | PRN
  Administered 2016-12-25: 2 mg via INTRAVENOUS

## 2016-12-25 MED ORDER — PROPOFOL 500 MG/50ML IV EMUL
INTRAVENOUS | Status: DC | PRN
  Administered 2016-12-25: 150 ug/kg/min via INTRAVENOUS

## 2016-12-25 MED ORDER — MIDAZOLAM HCL 2 MG/2ML IJ SOLN
INTRAMUSCULAR | Status: AC
  Filled 2016-12-25: qty 2

## 2016-12-25 MED ORDER — PROPOFOL 10 MG/ML IV BOLUS
INTRAVENOUS | Status: DC | PRN
  Administered 2016-12-25: 60 mg via INTRAVENOUS

## 2016-12-25 MED ORDER — PROPOFOL 500 MG/50ML IV EMUL
INTRAVENOUS | Status: AC
  Filled 2016-12-25: qty 50

## 2016-12-25 NOTE — Anesthesia Post-op Follow-up Note (Cosign Needed)
Anesthesia QCDR form completed.        

## 2016-12-25 NOTE — Discharge Instructions (Signed)
You are going to be scheduled for a Barium Enema radiology exam tomorrow at the hospital to evaluate a stricture (tight spot) in your colon.   Remain on a clear liquid diet until after the radiology exam tomorrow.

## 2016-12-25 NOTE — H&P (Signed)
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  Responded to IV antibiotics.  For colonoscopy to assess for secondary process. Tolerated prep well although it took 3 hours after drinking 2 liters for bowel movements to begin.  No severe episodes of pain as experienced in Spring of this year.   Prescriptions Prior to Admission  Medication Sig Dispense Refill Last Dose  . ARMOUR THYROID 30 MG tablet    12/24/2016 at Unknown time  . Calcium Carbonate-Vit D-Min (CALCIUM 1200 PO) Take by mouth daily.   Past Week at Unknown time  . Cholecalciferol (VITAMIN D3) 2000 UNITS TABS Take 2,000 Units by mouth 2 (two) times daily.   Past Week at Unknown time  . lubiprostone (AMITIZA) 8 MCG capsule Take by mouth.   Past Month at Unknown time  . Omega-3 Fatty Acids (FISH OIL PO) Take by mouth.   Past Month at Unknown time  . omeprazole (PRILOSEC OTC) 20 MG tablet Take by mouth as needed.    Past Week at Unknown time  . alendronate (FOSAMAX) 70 MG tablet Take by mouth.   Not Taking at Unknown time  . clobetasol ointment (TEMOVATE) 0.05 % Apply 1 application topically as needed.    Not Taking at Unknown time  . polyethylene glycol powder (GLYCOLAX/MIRALAX) powder 255 grams one bottle for colonoscopy prep 255 g 0   . sucralfate (CARAFATE) 1 g tablet Take 1 g by mouth as needed.   Taking  . vitamin E 100 UNIT capsule Take 100 Units by mouth daily.   Taking   No Known Allergies Past Medical History:  Diagnosis Date  . Diverticulosis   . GERD (gastroesophageal reflux disease)   . Osteoporosis    borderline  . Thyroid disease    hypothyriod   Past Surgical History:  Procedure Laterality Date  . BREAST SURGERY     sterotatic biopsy on right breast.  . COLONOSCOPY  05/25/2010   Social History   Social History  . Marital status: Married    Spouse name: N/A  . Number of children: N/A  . Years of education: N/A   Occupational History   . Not on file.   Social History Main Topics  . Smoking status: Current Every Day Smoker    Packs/day: 0.25    Types: Cigarettes  . Smokeless tobacco: Never Used     Comment: patient not thinking about it .  Marland Kitchen. Alcohol use No  . Drug use: No  . Sexual activity: Not Currently    Partners: Male    Birth control/ protection: Post-menopausal   Other Topics Concern  . Not on file   Social History Narrative  . No narrative on file   Social History   Social History Narrative  . No narrative on file     ROS: Negative.     PE: HEENT: Negative. Lungs: Clear. Cardio: RR.+ ABD: Soft.   Assessment/Plan:  Proceed with planned endoscopy.  Earline MayotteByrnett, [place] Fritze W 12/25/2016

## 2016-12-25 NOTE — Transfer of Care (Signed)
Immediate Anesthesia Transfer of Care Note  Patient: [name]  Procedure(s) Performed: Procedure(s): COLONOSCOPY WITH PROPOFOL (N/A)  Patient Location: PACU and Endoscopy Unit  Anesthesia Type:General  Level of Consciousness: sedated  Airway & Oxygen Therapy: Patient Spontanous Breathing and Patient connected to nasal cannula oxygen  Post-op Assessment: Report given to RN and Post -op Vital signs reviewed and stable  Post vital signs: Reviewed and stable  Last Vitals:  Vitals:   12/25/16 1448 12/25/16 1620  BP:  100/60  Pulse:  69  Resp:  (!) 25  Temp: 37.3 C (!) 36.1 C    Complications: No apparent anesthesia complications

## 2016-12-25 NOTE — Telephone Encounter (Signed)
Spoke with radiology scheduling and this test is a 2 day prep. The soonest they can schedule this is 12/28/16. The patient is scheduled for a single contrast Barium Enema at [facility] on 12/28/16 at 11:00 am. She will arrive by 10:45 am. She will need to pick up a prep kit today. This information was relayed to Jeannie Done in Endoscopy and she will inform the patient as she has not left endoscopy yet.

## 2016-12-25 NOTE — Op Note (Signed)
[facility] Gastroenterology Patient Name: Casey DoppWendelin Elsasser Procedure Date: 12/25/2016 3:30 PM MRN: [id number] Account #: [id number] Date of Birth: 05/31/1961 Admit Type: Outpatient Age: [id number] Room[facility] ENDO ROOM 1 Gender: Female Note Status: Finalized Procedure:            Colonoscopy Indications:          Abnormal CT of the GI tract Providers:            Earline MayotteJeffrey W. Lynne Takemoto, MD Referring MD:         Valentino NoseNathan Boswell (Referring MD) Medicines:            Monitored Anesthesia Care Complications:        No immediate complications. Procedure:            Pre-Anesthesia Assessment:                       - Prior to the procedure, a History and Physical was                        performed, and patient medications, allergies and                        sensitivities were reviewed. The patient's tolerance of                        previous anesthesia was reviewed.                       - The risks and benefits of the procedure and the                        sedation options and risks were discussed with the                        patient. All questions were answered and informed                        consent was obtained.                       After obtaining informed consent, the colonoscope was                        passed under direct vision. Throughout the procedure,                        the patient's blood pressure, pulse, and oxygen                        saturations were monitored continuously. The                        Colonoscope was introduced through the anus and                        advanced to the the sigmoid colon. The colonoscopy was                        performed with difficulty due to bowel stenosis.  Successful completion of the procedure was aided by                        changing the patient to a supine position. The patient                        tolerated the procedure well. The quality of the bowel   preparation was excellent. Findings:      Unable to pass the mid-sigmoid area secondary to inflammatory polyp and       failure to identify the lumen. Impression:           - No specimens collected. Recommendation:       - Discharge patient to home (via wheelchair).                       - Perform a single contrast barium enema tomorrow. Procedure Code(s):    --- Professional ---                       [phone], 53, Colonoscopy, flexible; diagnostic, including                        collection of specimen(s) by brushing or washing, when                        performed (separate procedure) Diagnosis Code(s):    --- Professional ---                       R93.3, Abnormal findings on diagnostic imaging of other                        parts of digestive tract CPT copyright 2016 American Medical Association. All rights reserved. The codes documented in this report are preliminary and upon coder review may  be revised to meet current compliance requirements. [name], MD 12/25/2016 4:21:30 PM This report has been signed electronically. Number of Addenda: 0 Note Initiated On: 12/25/2016 3:30 PM Total Procedure Duration: 0 hours 19 minutes 19 seconds       [facility]

## 2016-12-25 NOTE — Anesthesia Procedure Notes (Signed)
Date/Time: 12/25/2016 3:52 PM Performed by: Stormy FabianURTIS, Zakyia Gagan Pre-anesthesia Checklist: Patient identified, Emergency Drugs available, Suction available and Patient being monitored Patient Re-evaluated:Patient Re-evaluated prior to inductionOxygen Delivery Method: Nasal cannula Intubation Type: IV induction Dental Injury: Teeth and Oropharynx as per pre-operative assessment  Comments: Nasal cannula with etCO2 monitoring

## 2016-12-25 NOTE — Anesthesia Preprocedure Evaluation (Signed)
Anesthesia Evaluation  Patient identified by MRN, date of birth, ID band Patient awake    Reviewed: Allergy & Precautions, H&P , NPO status , Patient's Chart, lab work & pertinent test results  History of Anesthesia Complications Negative for: history of anesthetic complications  Airway Mallampati: III  TM Distance: >3 FB Neck ROM: limited    Dental  (+) Caps, Chipped   Pulmonary COPD, Current Smoker,           Cardiovascular Exercise Tolerance: Good (-) angina(-) Past MI negative cardio ROS       Neuro/Psych negative neurological ROS  negative psych ROS   GI/Hepatic Neg liver ROS, GERD  Controlled,  Endo/Other  Hypothyroidism   Renal/GU negative Renal ROS  negative genitourinary   Musculoskeletal   Abdominal   Peds  Hematology negative hematology ROS (+)   Anesthesia Other Findings Past Medical History: No date: Diverticulosis No date: GERD (gastroesophageal reflux disease) No date: Osteoporosis     Comment: borderline No date: Thyroid disease     Comment: hypothyriod  Past Surgical History: No date: BREAST SURGERY     Comment: sterotatic biopsy on right breast. 05/25/2010: COLONOSCOPY  BMI    Body Mass Index:  24.37 kg/m      Reproductive/Obstetrics negative OB ROS                             Anesthesia Physical Anesthesia Plan  ASA: III  Anesthesia Plan: General   Post-op Pain Management:    Induction: Intravenous  PONV Risk Score and Plan:   Airway Management Planned: Natural Airway and Nasal Cannula  Additional Equipment:   Intra-op Plan:   Post-operative Plan:   Informed Consent: I have reviewed the patients History and Physical, chart, labs and discussed the procedure including the risks, benefits and alternatives for the proposed anesthesia with the patient or authorized representative who has indicated his/her understanding and acceptance.   Dental  Advisory Given  Plan Discussed with: Anesthesiologist, CRNA and Surgeon  Anesthesia Plan Comments: (Patient consented for risks of anesthesia including but not limited to:  - adverse reactions to medications - damage to teeth, lips or other oral mucosa - sore throat or hoarseness - Damage to heart, brain, lungs or loss of life  Patient voiced understanding.)        Anesthesia Quick Evaluation

## 2016-12-25 NOTE — Anesthesia Postprocedure Evaluation (Signed)
Anesthesia Post Note  Patient: [name]  Procedure(Soto) Performed: Procedure(Soto) (LRB): COLONOSCOPY WITH PROPOFOL (N/A)  Patient location during evaluation: Endoscopy Anesthesia Type: General Level of consciousness: awake and alert Pain management: pain level controlled Vital Signs Assessment: post-procedure vital signs reviewed and stable Respiratory status: spontaneous breathing, nonlabored ventilation, respiratory function stable and patient connected to nasal cannula oxygen Cardiovascular status: blood pressure returned to baseline and stable Postop Assessment: no signs of nausea or vomiting Anesthetic complications: no     Last Vitals:  Vitals:   12/25/16 1642 12/25/16 1652  BP: 137/67 (!) 142/59  Pulse: (!) 58 (!) 59  Resp: 20 14  Temp:      Last Pain:  Vitals:   12/25/16 1620  TempSrc: Tympanic                 [name]

## 2016-12-25 NOTE — Telephone Encounter (Signed)
Pt needs single contrast BE tomorrow, she is already prepped, sigmoid diverticula stricture, per Dr Lemar LivingsByrnett.

## 2016-12-26 ENCOUNTER — Ambulatory Visit
Admission: RE | Admit: 2016-12-26 | Discharge: 2016-12-26 | Disposition: A | Discharge status: Home / Self Care | Payer: BC Managed Care – PPO | Source: Ambulatory Visit | Attending: General Surgery | Admitting: General Surgery

## 2016-12-26 ENCOUNTER — Ambulatory Visit (INDEPENDENT_AMBULATORY_CARE_PROVIDER_SITE_OTHER): Payer: BC Managed Care – PPO | Admitting: General Surgery

## 2016-12-26 ENCOUNTER — Encounter: Payer: Self-pay | Admitting: General Surgery

## 2016-12-26 VITALS — BP 130/82 | HR 78 | Resp 14 | Ht 64.0 in | Wt 140.0 lb

## 2016-12-26 DIAGNOSIS — R1084 Generalized abdominal pain: Secondary | ICD-10-CM

## 2016-12-26 DIAGNOSIS — K56699 Other intestinal obstruction unspecified as to partial versus complete obstruction: Secondary | ICD-10-CM | POA: Insufficient documentation

## 2016-12-26 NOTE — Patient Instructions (Addendum)
May try Fiber supplement that works for you and take once daily. Bene fiber or fiber cookies (Metawafers).  Follow up in 3 months.

## 2016-12-26 NOTE — Progress Notes (Signed)
Patient ID: [name], female   DOB: 07-13-1960, 56 y.o.   MRN: [id number]  Chief Complaint  Patient presents with  . Other    results discussion    HPI [name] is a 56 y.o. female here to discuss her Barium Enema results from today. She had a colonoscopy yesterday that was incomplete. She is here today with her husband [name].   HPI  Past Medical History:  Diagnosis Date  . Diverticulosis   . GERD (gastroesophageal reflux disease)   . Osteoporosis    borderline  . Thyroid disease    hypothyriod    Past Surgical History:  Procedure Laterality Date  . BREAST SURGERY     sterotatic biopsy on right breast.  . COLONOSCOPY  05/25/2010  . COLONOSCOPY WITH PROPOFOL N/A 12/25/2016   Procedure: COLONOSCOPY WITH PROPOFOL;  Surgeon: [name], MD;  Location: [facility] ENDOSCOPY;  Service: Endoscopy;  Laterality: N/A;    Family History  Problem Relation Age of Onset  . Heart disease Father        congestive heart failure  . Hypothyroidism Sister   . Arthritis Sister        RA  . Diabetes Maternal Grandmother   . Hypothyroidism Maternal Grandmother   . Alzheimer's disease Paternal Grandmother   . Hypothyroidism Sister     Social History Social History  Substance Use Topics  . Smoking status: Current Every Day Smoker    Packs/day: 0.25    Types: Cigarettes  . Smokeless tobacco: Never Used     Comment: patient not thinking about it .  Marland Kitchen Alcohol use No    No Known Allergies  Current Outpatient Prescriptions  Medication Sig Dispense Refill  . alendronate (FOSAMAX) 70 MG tablet Take by mouth.    Mack Guise THYROID 30 MG tablet     . Calcium Carbonate-Vit D-Min (CALCIUM 1200 PO) Take by mouth daily.    . Cholecalciferol (VITAMIN D3) 2000 UNITS TABS Take 2,000 Units by mouth 2 (two) times daily.    . clobetasol ointment (TEMOVATE) 0.05 % Apply 1 application topically as needed.     . lubiprostone (AMITIZA) 8 MCG capsule Take by mouth.    . Omega-3  Fatty Acids (FISH OIL PO) Take by mouth.    Marland Kitchen omeprazole (PRILOSEC OTC) 20 MG tablet Take by mouth as needed.     . sucralfate (CARAFATE) 1 g tablet Take 1 g by mouth as needed.    . vitamin E 100 UNIT capsule Take 100 Units by mouth daily.     No current facility-administered medications for this visit.     Review of Systems Review of Systems  Constitutional: Negative.   Respiratory: Negative.   Cardiovascular: Negative.     Blood pressure 130/82, pulse 78, resp. rate 14, height 5\' 4"  (1.626 m), weight 140 lb (63.5 kg).  Physical Exam Physical Exam  Constitutional: She is oriented to person, place, and time. She appears well-developed and well-nourished.  Neurological: She is alert and oriented to person, place, and time.  Skin: Skin is warm and dry.  Psychiatric: She has a normal mood and affect.    Data Reviewed Barium enema (limited) reviewed on digital imaging and by phone w/ radiologist. Mild narrowing just past a 180 degree corner accounted for inability to pass scope. No diverticuli identified. No extravasation.   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    Plan    Daily fiver supplement. Return  exam in three months, earlier if recurrent symptoms.                    HPI, Physical Exam, Assessment and Plan have been scribed under the direction and in the presence of Earline MayotteJeffrey W. Byrnett, MD  Carron Brazenaryl-Lyn Kennedy, LPN  I have completed the exam and reviewed the above documentation for accuracy and completeness.  I agree with the above.  Museum[employer]gallery [employer] has been used and any errors in dictation or transcription are unintentional.  Donnalee CurryJeffrey Byrnett, M.D., F.A.C.S.  Earline MayotteByrnett, Jeffrey W 12/27/2016, 9:46 PM

## 2016-12-28 ENCOUNTER — Ambulatory Visit: Admission: RE | Admit: 2016-12-28 | Payer: BC Managed Care – PPO | Source: Ambulatory Visit | Admitting: General Surgery

## 2017-01-16 ENCOUNTER — Ambulatory Visit (INDEPENDENT_AMBULATORY_CARE_PROVIDER_SITE_OTHER): Payer: BC Managed Care – PPO | Admitting: General Surgery

## 2017-01-16 ENCOUNTER — Encounter: Payer: Self-pay | Admitting: General Surgery

## 2017-01-16 VITALS — BP 130/74 | HR 74 | Ht 64.0 in | Wt 140.0 lb

## 2017-01-16 DIAGNOSIS — R1084 Generalized abdominal pain: Secondary | ICD-10-CM

## 2017-01-16 NOTE — Progress Notes (Addendum)
Patient ID: Casey KeelerWendelin J Waltermire, female   DOB: 05/15/1961, 56 y.o.   MRN: [id number]  Chief Complaint  Patient presents with  . Follow-up    HPI [name] is a 56 y.o. female here today following up from her colonoscopy done on 12/25/2016. Daughter, Clelia SchaumannBeth Smith is present at visit.   There is concern about the patient's weight loss, dating back to well before she was evaluated locally, minimally progressive over the last several months.  The patient reports no difficulty with eating, and has had rare episodes of transient lower abdominal pain. No clear dietary pattern. She has had some reflux, no worse than baseline.  HPI  Past Medical History:  Diagnosis Date  . Diverticulosis   . GERD (gastroesophageal reflux disease)   . Osteoporosis    borderline  . Thyroid disease    hypothyriod    Past Surgical History:  Procedure Laterality Date  . BREAST SURGERY     sterotatic biopsy on right breast.  . COLONOSCOPY  05/25/2010  . COLONOSCOPY WITH PROPOFOL N/A 12/25/2016   Procedure: COLONOSCOPY WITH PROPOFOL;  Surgeon: Earline MayotteByrnett, Dorette Hartel W, MD;  Location: [facility] ENDOSCOPY;  Service: Endoscopy;  Laterality: N/A;    Family History  Problem Relation Age of Onset  . Heart disease Father        congestive heart failure  . Hypothyroidism Sister   . Arthritis Sister        RA  . Diabetes Maternal Grandmother   . Hypothyroidism Maternal Grandmother   . Alzheimer's disease Paternal Grandmother   . Hypothyroidism Sister     Social History Social History  Substance Use Topics  . Smoking status: Current Every Day Smoker    Packs/day: 0.25    Types: Cigarettes  . Smokeless tobacco: Never Used     Comment: patient not thinking about it .  Marland Kitchen. Alcohol use No    No Known Allergies  Current Outpatient Prescriptions  Medication Sig Dispense Refill  . alendronate (FOSAMAX) 70 MG tablet Take by mouth.    Mack Guise. ARMOUR THYROID 30 MG tablet     . Calcium Carbonate-Vit D-Min (CALCIUM 1200  PO) Take by mouth daily.    . Cholecalciferol (VITAMIN D3) 2000 UNITS TABS Take 2,000 Units by mouth 2 (two) times daily.    . clobetasol ointment (TEMOVATE) 0.05 % Apply 1 application topically as needed.     . lubiprostone (AMITIZA) 8 MCG capsule Take by mouth.    . Omega-3 Fatty Acids (FISH OIL PO) Take by mouth.    Marland Kitchen. omeprazole (PRILOSEC OTC) 20 MG tablet Take by mouth as needed.     . sucralfate (CARAFATE) 1 g tablet Take 1 g by mouth as needed.    . vitamin E 100 UNIT capsule Take 100 Units by mouth daily.     No current facility-administered medications for this visit.     Review of Systems Review of Systems  Constitutional: Negative.   Respiratory: Negative.   Cardiovascular: Negative.     Blood pressure 130/74, pulse 74, height 5\' 4"  (1.626 m), weight 140 lb (63.5 kg).  Physical Exam Physical Exam Deferred  Data Reviewed Prior barium enema films reviewed with patient and daughter.  [facility]atory [facility]2017 [facility] ('s fasting) were provided by the patient. Hemoglobin 12.4, white blood cell count 11,200 with normal differential, platelet count 410,000. Comprehensive metabolic panel: Normal.  Ferritin 86, normal TIBC 225, slightly low. Iron saturation 23, slightly low, iron saturation 10%, slightly low.  T4 1 0.3, T3, free 4.4 (upper limit of normal) sees TSH low 0.3, vitamin D: Normal.  Assessment    Previous pelvic abscess, likely diverticular source although not proven based on CT or barium enema imaging.  No evidence of colon cancer based on endoscopy (Limited) or barium enema.    Plan    We spent about 20 minutes reviewing her past interventions and imaging studies. The concern for occult colon cancer I think has been put to rest.    Patient to return in October 2018 for clinical reassessment.  She will call earlier if she has any abdominal discomfort lasting more than 24 hours or is severe.    HPI, Physical Exam,  Assessment and Plan have been scribed under the direction and in the presence of Donnalee CurryJeffrey Ezabella Teska, MD.  Ples SpecterJessica Qualls, CMA  I have completed the exam and reviewed the above documentation for accuracy and completeness.  I agree with the above.  Museum[employer]gallery [employer] has been used and any errors in dictation or transcription are unintentional.  Donnalee CurryJeffrey Mizael Sagar, M.D., F.A.C.S.   Earline MayotteByrnett, Juliette Standre W 01/17/2017, 7:57 PM

## 2017-04-03 ENCOUNTER — Ambulatory Visit (INDEPENDENT_AMBULATORY_CARE_PROVIDER_SITE_OTHER): Payer: BC Managed Care – PPO | Admitting: General Surgery

## 2017-04-03 VITALS — BP 150/78 | HR 69 | Resp 14 | Ht 67.0 in | Wt 141.0 lb

## 2017-04-03 DIAGNOSIS — R1084 Generalized abdominal pain: Secondary | ICD-10-CM

## 2017-04-03 DIAGNOSIS — Z8719 Personal history of other diseases of the digestive system: Secondary | ICD-10-CM

## 2017-04-03 NOTE — Progress Notes (Signed)
Patient ID: [name], female   DOB: 05/07/1961, 56 y.o.   MRN: [id number]  Chief Complaint  Patient presents with  . Follow-up    HPI [name] is a 56 y.o. female here today for her follow up abdominal pain. Patient states the last time she had abdominal pain was in August, 2018.She had three back to back weekend with pain.  She states she is doing much better now. Moving her bowels daily.  HPI  Past Medical History:  Diagnosis Date  . Diverticulosis   . GERD (gastroesophageal reflux disease)   . Osteoporosis    borderline  . Thyroid disease    hypothyriod    Past Surgical History:  Procedure Laterality Date  . BREAST SURGERY     sterotatic biopsy on right breast.  . COLONOSCOPY  05/25/2010  . COLONOSCOPY WITH PROPOFOL N/A 12/25/2016   Procedure: COLONOSCOPY WITH PROPOFOL;  Surgeon: Earline MayotteByrnett, Jeffrey W, MD;  Location: [facility] ENDOSCOPY;  Service: Endoscopy;  Laterality: N/A;    Family History  Problem Relation Age of Onset  . Heart disease Father        congestive heart failure  . Hypothyroidism Sister   . Arthritis Sister        RA  . Diabetes Maternal Grandmother   . Hypothyroidism Maternal Grandmother   . Alzheimer's disease Paternal Grandmother   . Hypothyroidism Sister     Social History Social History  Substance Use Topics  . Smoking status: Current Every Day Smoker    Packs/day: 0.25    Types: Cigarettes  . Smokeless tobacco: Never Used     Comment: patient not thinking about it .  Marland Kitchen. Alcohol use No    No Known Allergies  Current Outpatient Prescriptions  Medication Sig Dispense Refill  . alendronate (FOSAMAX) 70 MG tablet Take by mouth.    Mack Guise. ARMOUR THYROID 30 MG tablet     . Calcium Carbonate-Vit D-Min (CALCIUM 1200 PO) Take by mouth daily.    . Cholecalciferol (VITAMIN D3) 2000 UNITS TABS Take 2,000 Units by mouth 2 (two) times daily.    . clobetasol ointment (TEMOVATE) 0.05 % Apply 1 application topically as needed.     .  lubiprostone (AMITIZA) 8 MCG capsule Take by mouth.    . Omega-3 Fatty Acids (FISH OIL PO) Take by mouth.    Marland Kitchen. omeprazole (PRILOSEC OTC) 20 MG tablet Take by mouth as needed.     . sucralfate (CARAFATE) 1 g tablet Take 1 g by mouth as needed.    . vitamin E 100 UNIT capsule Take 100 Units by mouth daily.     No current facility-administered medications for this visit.     Review of Systems Review of Systems  Constitutional: Negative.   Respiratory: Negative.   Cardiovascular: Negative.   Gastrointestinal: Negative.     Blood pressure (!) 150/78, pulse 69, resp. rate 14, height 5\' 7"  (1.702 m), weight 141 lb (64 kg). Patient's weight 03/19/2015: 140 pounds.  Physical Exam Physical Exam  Constitutional: She is oriented to person, place, and time. She appears well-developed and well-nourished.  Abdominal: Soft. Normal appearance and bowel sounds are normal. There is no tenderness.  Neurological: She is alert and oriented to person, place, and time.  Skin: Skin is warm and dry.    Data Reviewed 10/04/2016 CT for planned pelvic fluid drainage showed a marked decrease in size and drainage was not required.  Barium enema dated 12/26/2016 showed mild short segment stricture the sigmoid colon  without obstruction. No ulceration. No diverticuli.  Colonoscopy of 12/25/2016 and showed a stricture in the rectosigmoid without mucosal abnormality.  Assessment    Suspected diverticular abscess with resulting stricture, presently minimally symptomatic.    Plan    The patient reports no symptoms in the last 6 weeks. Certainly with her history over the earlier part of this year she may have recurrent symptoms, although there was no evidence of proximal obstruction to the area of narrowing in the sigmoid. Should she develop new symptoms we need to think seriously about a resection of this area.  The patient was encouraged to call promptly if she develops any recurrent abdominal pain, and not  to be held back and calling for the fear recommendation for surgery.   Patient to return as needed. The patient is aware to call back for any questions or concerns.   HPI, Physical Exam, Assessment and Plan have been scribed under the direction and in the presence of Donnalee Curry, MD.  [name], CMA  I have completed the exam and reviewed the above documentation for accuracy and completeness.  I agree with the above.  Museum/gallery conservator has been used and any errors in dictation or transcription are unintentional.  Donnalee Curry, M.D., F.A.C.S.   [name] 04/05/2017, 8:12 AM

## 2017-04-03 NOTE — Patient Instructions (Signed)
Return as needed

## 2017-04-04 DIAGNOSIS — K439 Ventral hernia without obstruction or gangrene: Secondary | ICD-10-CM | POA: Insufficient documentation

## 2017-08-27 ENCOUNTER — Ambulatory Visit: Payer: BC Managed Care – PPO | Admitting: General Surgery

## 2017-08-27 ENCOUNTER — Other Ambulatory Visit
Admission: RE | Admit: 2017-08-27 | Discharge: 2017-08-27 | Disposition: A | Discharge status: Home / Self Care | Payer: BC Managed Care – PPO | Source: Ambulatory Visit | Attending: General Surgery | Admitting: General Surgery

## 2017-08-27 ENCOUNTER — Encounter: Payer: Self-pay | Admitting: General Surgery

## 2017-08-27 VITALS — BP 126/76 | HR 88 | Resp 12 | Ht 64.0 in | Wt 141.0 lb

## 2017-08-27 DIAGNOSIS — R1032 Left lower quadrant pain: Secondary | ICD-10-CM | POA: Diagnosis not present

## 2017-08-27 DIAGNOSIS — R195 Other fecal abnormalities: Secondary | ICD-10-CM | POA: Diagnosis not present

## 2017-08-27 DIAGNOSIS — R1084 Generalized abdominal pain: Secondary | ICD-10-CM | POA: Insufficient documentation

## 2017-08-27 LAB — BASIC METABOLIC PANEL
Anion gap: 9 (ref 5–15)
BUN: 13 mg/dL (ref 6–20)
CHLORIDE: 103 mmol/L (ref 101–111)
CO2: 25 mmol/L (ref 22–32)
Calcium: 8.4 mg/dL — ABNORMAL LOW (ref 8.9–10.3)
Creatinine, Ser: 0.57 mg/dL (ref 0.44–1.00)
Glucose, Bld: 91 mg/dL (ref 65–99)
POTASSIUM: 3.6 mmol/L (ref 3.5–5.1)
SODIUM: 137 mmol/L (ref 135–145)

## 2017-08-27 LAB — CBC WITH DIFFERENTIAL/PLATELET
BASOS ABS: 0.1 10*3/uL (ref 0–0.1)
Basophils Relative: 1 %
Eosinophils Absolute: 0 10*3/uL (ref 0–0.7)
Eosinophils Relative: 0 %
HCT: 36.8 % (ref 35.0–47.0)
HEMOGLOBIN: 12.4 g/dL (ref 12.0–16.0)
LYMPHS ABS: 1.6 10*3/uL (ref 1.0–3.6)
LYMPHS PCT: 13 %
MCH: 29.1 pg (ref 26.0–34.0)
MCHC: 33.7 g/dL (ref 32.0–36.0)
MCV: 86.4 fL (ref 80.0–100.0)
Monocytes Absolute: 0.7 10*3/uL (ref 0.2–0.9)
Monocytes Relative: 6 %
Neutro Abs: 9.4 10*3/uL — ABNORMAL HIGH (ref 1.4–6.5)
Neutrophils Relative %: 80 %
PLATELETS: 349 10*3/uL (ref 150–440)
RBC: 4.26 MIL/uL (ref 3.80–5.20)
RDW: 14 % (ref 11.5–14.5)
WBC: 11.9 10*3/uL — AB (ref 3.6–11.0)

## 2017-08-27 NOTE — Patient Instructions (Addendum)
Follow up appointment to be announced.  The patient is scheduled for a CT abdomen pelvis with contrast at [facility] on 09/05/17 at 8:00 am. She will arrive by 7:45 am and have nothing to eat or drink for 4 hours prior. She will pick up a prep kit today and have her labs done at [facility]. The patient is aware of date, time, and instructions.

## 2017-08-27 NOTE — Progress Notes (Signed)
Patient ID: [name], female   DOB: 1961/04/14, 57 y.o.   MRN: [id number]  Chief Complaint  Patient presents with  . Abdominal Pain    HPI [name] is a 57 y.o. female here today for a evaluation of abdominal pain. The pain is located in her left lower quadrant and in the middle lower stomach, it started in December 2018.  The pains comes and goes. Two weeks ago was her last spell. No nausea or vomiting with the spells. She has been having loose stools more often. Feel lots of pressure in her rectal area. She using a heating pad. No foods Last PAP was last year.  Last flexible sigmoidoscopy evidence of a benign stricture was in 2018.  In the sigmoid. HPI   Past Medical History:  Diagnosis Date  . Diverticulosis   . GERD (gastroesophageal reflux disease)   . Osteoporosis    borderline  . Thyroid disease    hypothyriod    Past Surgical History:  Procedure Laterality Date  . BREAST SURGERY     sterotatic biopsy on right breast.  . COLONOSCOPY  05/25/2010  . COLONOSCOPY WITH PROPOFOL N/A 12/25/2016   Flexible sigmoidoscopy, post procedure barium enema showing narrowing without mucosal abnormality in the sigmoid.    Family History  Problem Relation Age of Onset  . Heart disease Father        congestive heart failure  . Hypothyroidism Sister   . Arthritis Sister        RA  . Diabetes Maternal Grandmother   . Hypothyroidism Maternal Grandmother   . Alzheimer's disease Paternal Grandmother   . Hypothyroidism Sister     Social History Social History   Tobacco Use  . Smoking status: Current Every Day Smoker    Packs/day: 0.25    Types: Cigarettes  . Smokeless tobacco: Never Used  . Tobacco comment: patient not thinking about it .  Substance Use Topics  . Alcohol use: No  . Drug use: No    No Known Allergies  Current Outpatient Medications  Medication Sig Dispense Refill  . alendronate (FOSAMAX) 70 MG tablet Take by mouth.    Francia Greaves THYROID 30 MG  tablet     . Calcium Carbonate-Vit D-Min (CALCIUM 1200 PO) Take by mouth daily.    . Cholecalciferol (VITAMIN D3) 2000 UNITS TABS Take 2,000 Units by mouth 2 (two) times daily.    . Omega-3 Fatty Acids (FISH OIL PO) Take by mouth.    Marland Kitchen omeprazole (PRILOSEC OTC) 20 MG tablet Take by mouth as needed.     . sucralfate (CARAFATE) 1 g tablet Take 1 g by mouth as needed.    . vitamin E 100 UNIT capsule Take 100 Units by mouth daily.    . clobetasol ointment (TEMOVATE) 2.35 % Apply 1 application topically as needed.      No current facility-administered medications for this visit.     Review of Systems Review of Systems  Constitutional: Negative.   Respiratory: Negative.   Gastrointestinal: Positive for abdominal pain.    Blood pressure 126/76, pulse 88, resp. rate 12, height '5\' 4"'$  (1.626 m), weight 141 lb (64 kg). The patient's weight is unchanged from October 2018.  Physical Exam Physical Exam  Constitutional: She is oriented to person, place, and time. She appears well-developed and well-nourished.  Cardiovascular: Normal rate, regular rhythm and normal heart sounds.  Pulmonary/Chest: Effort normal and breath sounds normal.  Abdominal: Soft. Bowel sounds are normal.  Neurological: She is alert and oriented to person, place, and time.  Skin: Skin is warm and dry.    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    Plan  Patient to have a ct scan  and labs done.  Patient may try the Bentall after the ct scan. cbc with diff and BMP labs, ct abd and pelvis with contrast  HPI, Physical Exam, Assessment and Plan have been scribed under the direction and in the presence of Hervey Ard, MD.  Gaspar Cola, CMA  I have completed the exam and reviewed the above documentation for accuracy and completeness.  I agree with the above.  Haematologist has been used and any errors in dictation or transcription are  unintentional.  Hervey Ard, M.D., F.A.C.S.  The patient is scheduled for a CT abdomen pelvis with contrast at [facility] on 09/05/17 at 8:00 am. She will arrive by 7:45 am and have nothing to eat or drink for 4 hours prior. She will pick up a prep kit today and have her labs done at [facility]. The patient is aware of date, time, and instructions.  Documented by Caryl-Lyn Otis Brace LPN   Forest Gleason Sumayya Muha 08/28/2017, 8:35 PM

## 2017-08-28 ENCOUNTER — Encounter: Payer: Self-pay | Admitting: General Surgery

## 2017-09-05 ENCOUNTER — Ambulatory Visit
Admission: RE | Admit: 2017-09-05 | Discharge: 2017-09-05 | Disposition: A | Discharge status: Home / Self Care | Payer: BC Managed Care – PPO | Source: Ambulatory Visit | Attending: General Surgery | Admitting: General Surgery

## 2017-09-05 DIAGNOSIS — R1084 Generalized abdominal pain: Secondary | ICD-10-CM | POA: Diagnosis present

## 2017-09-05 DIAGNOSIS — R1032 Left lower quadrant pain: Secondary | ICD-10-CM | POA: Diagnosis present

## 2017-09-05 DIAGNOSIS — I700 Atherosclerosis of aorta: Secondary | ICD-10-CM | POA: Insufficient documentation

## 2017-09-05 DIAGNOSIS — R933 Abnormal findings on diagnostic imaging of other parts of digestive tract: Secondary | ICD-10-CM | POA: Insufficient documentation

## 2017-09-05 MED ORDER — IOPAMIDOL (ISOVUE-300) INJECTION 61%
100.0000 mL | Freq: Once | INTRAVENOUS | Status: AC | PRN
  Administered 2017-09-05: 100 mL via INTRAVENOUS

## 2017-09-12 ENCOUNTER — Other Ambulatory Visit: Payer: Self-pay | Admitting: General Surgery

## 2017-09-12 ENCOUNTER — Telehealth: Payer: Self-pay | Admitting: General Surgery

## 2017-09-12 MED ORDER — DICYCLOMINE HCL 20 MG PO TABS: 20.0000 mg | ORAL_TABLET | Freq: Three times a day (TID) | ORAL | 1 refills | Status: DC

## 2017-09-12 NOTE — Progress Notes (Unsigned)
Reviewed CT results by phone. Less frequent symptoms since last OV. Will try Miralax, 1 capful daily along w/ Benefiber. Trial Bentyl 20 mg TID x 1 week.  Phone report in one week.

## 2017-09-12 NOTE — Telephone Encounter (Signed)
Patient called in and would like results from her ABD/PEL CT scan [facility].

## 2017-09-13 ENCOUNTER — Telehealth: Payer: Self-pay

## 2017-09-13 NOTE — Telephone Encounter (Signed)
Patient notified. Will be sure to call next Friday to give and update.

## 2017-09-13 NOTE — Telephone Encounter (Signed)
-----   Message from Earline MayotteJeffrey W Byrnett, MD sent at 09/12/2017  6:05 PM EDT ----- Notify patient Bentyl RX sent to pharmacy. Remind her I would like her to use one capful of Miralax in 8 oz water, juice daily along with benefiber. Bentyl 3 x/ day for the next week. She is to call Friday AM with a progress report.  Thanks.

## 2017-09-20 ENCOUNTER — Telehealth: Payer: Self-pay

## 2017-09-20 NOTE — Telephone Encounter (Signed)
Patient called with update on medication. She states that she cannot continue to use the Bentyl due to the side effects. She is having headaches and dry mouth. She did take this for a full week along with the Bene fiber and Mira lax. She reports that she still has the same pattern of pain and symptoms. The only change she can see is she does not feel so much pressure and fullness in the top of her stomach after eating.  She would like to know what the next step may be and to discuss this with Dr Lemar LivingsByrnett.

## 2017-10-15 ENCOUNTER — Ambulatory Visit: Payer: BC Managed Care – PPO | Admitting: General Surgery

## 2017-10-15 ENCOUNTER — Encounter: Payer: Self-pay | Admitting: General Surgery

## 2017-10-15 VITALS — BP 118/78 | HR 80 | Resp 12 | Ht 64.0 in | Wt 139.0 lb

## 2017-10-15 DIAGNOSIS — K56699 Other intestinal obstruction unspecified as to partial versus complete obstruction: Secondary | ICD-10-CM

## 2017-10-15 DIAGNOSIS — K5790 Diverticulosis of intestine, part unspecified, without perforation or abscess without bleeding: Secondary | ICD-10-CM

## 2017-10-15 NOTE — Progress Notes (Signed)
Patient ID: [name], female   DOB: 02/09/1961, 57 y.o.   MRN: [id number]  Chief Complaint  Patient presents with  . Other    HPI [name] is a 57 y.o. female she is here to discuss colon surgery. She states she has improved from what she was in the past. She states her normal bowel movement is every 2-3 days. She states she has a very small amount of stool daily. She admits to twinges of pain occasionally. She is back to taking benefiber. She states she is unsure when her bowels will "flare up", no pattern or dietary triggers. 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  HPI  Past Medical History:  Diagnosis Date  . Diverticulosis   . GERD (gastroesophageal reflux disease)   . Osteoporosis    borderline  . Thyroid disease    hypothyriod    Past Surgical History:  Procedure Laterality Date  . BREAST SURGERY     sterotatic biopsy on right breast.  . COLONOSCOPY  05/25/2010  . COLONOSCOPY WITH PROPOFOL N/A 12/25/2016   Flexible sigmoidoscopy, post procedure barium enema showing narrowing without mucosal abnormality in the sigmoid.    Family History  Problem Relation Age of Onset  . Heart disease Father        congestive heart failure  . Hypothyroidism Sister   . Arthritis Sister        RA  . Diabetes Maternal Grandmother   . Hypothyroidism Maternal Grandmother   . Alzheimer's disease Paternal Grandmother   . Hypothyroidism Sister     Social History Social History   Tobacco Use  . Smoking status: Current Every Day Smoker    Packs/day: 0.25    Types: Cigarettes  . Smokeless tobacco: Never Used  . Tobacco comment: patient not thinking about it .  Substance Use Topics  . Alcohol use: No  . Drug use: No    No Known Allergies  Current Outpatient Medications  Medication Sig Dispense Refill  . ARMOUR THYROID 30 MG tablet     . Ascorbic Acid (VITAMIN C) 100 MG tablet Take 100 mg by mouth daily.    . Calcium Carbonate-Vit D-Min (CALCIUM  1200 PO) Take by mouth daily.    . Cholecalciferol (VITAMIN D3) 2000 UNITS TABS Take 2,000 Units by mouth 2 (two) times daily.    . clobetasol ointment (TEMOVATE) 4.09 % Apply 1 application topically as needed.     . magnesium oxide (MAG-OX) 400 MG tablet Take 400 mg by mouth daily.    . Omega-3 Fatty Acids (FISH OIL PO) Take by mouth.    Marland Kitchen omeprazole (PRILOSEC OTC) 20 MG tablet Take by mouth as needed.     . vitamin E 100 UNIT capsule Take 100 Units by mouth daily.    . Wheat Dextrin (BENEFIBER) POWD Take by mouth daily.     No current facility-administered medications for this visit.     Review of Systems Review of Systems  Constitutional: Negative.   Respiratory: Negative.   Cardiovascular: Negative.   Gastrointestinal: Positive for constipation.    Blood pressure 118/78, pulse 80, resp. rate 12, height '5\' 4"'$  (1.626 m), weight 139 lb (63 kg), SpO2 98 %.  Physical Exam Physical Exam  Constitutional: She is oriented to person, place, and time. She appears well-developed and well-nourished.  Eyes: No scleral icterus.  Neck: Normal range of motion. Neck supple.  Cardiovascular: Normal rate, regular rhythm, normal heart sounds and intact distal pulses.  No  lower leg edema  Pulmonary/Chest: Effort normal and breath sounds normal.  Abdominal: Soft. Normal appearance and bowel sounds are normal. There is no tenderness.  Lymphadenopathy:    She has no cervical adenopathy.  Neurological: She is alert and oriented to person, place, and time.  Skin: Skin is warm and dry.  Psychiatric: Her behavior is normal.    Data Reviewed Prior imaging studies.  Assessment    Chronic diverticular stricture with intermittent symptoms, incomplete resolution with bowel management, antispasmodic therapy.    Plan      The majority of the visit was discusse elucidating the patient's goals.  She is significantly better than she we first met a year ago, but is still having episodes of abdominal  discomfort and variable stool habits that make it difficult for her to go out and socialize, for fear of diarrhea.  She had had a pelvic abscess in 2018 which resolved without surgical or radiologic intervention.  Indications for surgery reviewed.  Risks associated with colonic surgery discussed.  Opportunity for second surgical opinion reviewed.     HPI, Physical Exam, Assessment and Plan have been scribed under the direction and in the presence of Robert Bellow, MD. [name], RN  I have completed the exam and reviewed the above documentation for accuracy and completeness.  I agree with the above.  Haematologist has been used and any errors in dictation or transcription are unintentional.  Hervey Ard, M.D., F.A.C.S.  [name] 10/16/2017, 4:59 AM

## 2017-10-15 NOTE — Patient Instructions (Addendum)
The patient is aware to call back for any questions or new concerns.  Laparoscopic Colectomy Laparoscopic colectomy is surgery to remove part or all of the large intestine (colon). This procedure may be used to treat several conditions, including:  Inflammation and infection of the colon (diverticulitis).  Tumors or masses in the colon.  Inflammatory bowel disease, such as Crohn disease or ulcerative colitis. Colectomy is an option when symptoms cannot be controlled with medicines.  Bleeding from the colon that cannot be controlled by another method.  Blockage or obstruction of the colon.  Tell a health care provider about:  Any allergies you have.  All medicines you are taking, including vitamins, herbs, eye drops, creams, and over-the-counter medicines.  Any problems you or family members have had with anesthetic medicines.  Any blood disorders you have.  Any surgeries you have had.  Any medical conditions you have. What are the risks? Generally, this is a safe procedure. However, problems may occur, including:  Infection.  Bleeding.  Allergic reactions to medicines or dyes.  Damage to other structures or organs.  Leaking from where the colon was sewn together.  Future blockage of the small intestines from scar tissue. Another surgery may be needed to repair this.  Needing to convert to an open procedure. Complications such as damage to other organs or excessive bleeding may require the surgeon to convert from a laparoscopic procedure to an open procedure. This involves making a larger incision in the abdomen.  What happens before the procedure? Staying hydrated Follow instructions from your health care provider about hydration, which may include:  Up to 2 hours before the procedure - you may continue to drink clear liquids, such as water, clear fruit juice, black coffee, and plain tea.  Eating and drinking restrictions Follow instructions from your health care  provider about eating and drinking, which may include:  8 hours before the procedure - stop eating heavy meals, meals with high fiber, or foods such as meat, fried foods, or fatty foods.  6 hours before the procedure - stop eating light meals or foods, such as toast or cereal.  6 hours before the procedure - stop drinking milk or drinks that contain milk.  2 hours before the procedure - stop drinking clear liquids.  Medicines  Ask your health care provider about: ? Changing or stopping your regular medicines. This is especially important if you are taking diabetes medicines or blood thinners. ? Taking medicines such as aspirin and ibuprofen. These medicines can thin your blood. Do not take these medicines before your procedure if your health care provider instructs you not to.  You may be given antibiotic medicine to clean out bacteria from your colon. Follow the directions carefully and take the medicine at the correct time. General instructions  You may be prescribed an oral bowel prep to clean out your colon in preparation for the surgery: ? Follow instructions from your health care provider about how to do this. ? Do not eat or drink anything else after you have started the bowel prep, unless your health care provider tells you it is safe to do so.  Do not use any products that contain nicotine or tobacco, such as cigarettes and e-cigarettes. If you need help quitting, ask your health care provider. What happens during the procedure?  To reduce your risk of infection: ? Your health care team will wash or sanitize their hands. ? Your skin will be washed with soap.  An IV tube will  be inserted into one of your veins to deliver fluid and medication.  You will be given one of the following: ? A medicine to help you relax (sedative). ? A medicine to make you fall asleep (general anesthetic).  Small monitors will be connected to your body. They will be used to check your heart,  blood pressure, and oxygen level.  A breathing tube may be placed into your lungs during the procedure.  A thin, flexible tube (catheter) will be placed into your bladder to drain urine.  A tube may be placed through your nose and into your stomach to drain stomach fluids (nasogastric tube, or NG tube).  Your abdomen will be filled with air so it expands. This gives the surgeon more room to operate and makes your organs easier to see.  Several small cuts (incisions) will be made in your abdomen.  A thin, lighted tube with a tiny camera on the end (laparoscope) will be put through one of the small incisions. The camera on the laparoscope will send a picture to a computer screen in the operating room. This will give the surgeon a good view inside your abdomen.  Hollow tubes will be put through the other small incisions in your abdomen. The tools that are needed for the procedure will be put through these tubes.  Clamps or staples will be put on both ends of the diseased part of the colon.  The part of the intestine between the clamps or staples will be removed.  If possible, the ends of the healthy colon that remain will be stitched (sutured) or stapled together to allow your body to pass waste (stool).  Sometimes, the remaining colon cannot be stitched back together. If this is the case, a colostomy will be needed. If you need a colostomy: ? An opening to the outside of your body (stoma) will be made through your abdomen. ? The end of your colon will be brought to the opening. It will be stitched to the skin. ? A bag will be attached to the opening. Stool will drain into this removable bag. ? The colostomy may be temporary or permanent.  The incisions from the colectomy will be closed with sutures or staples. The procedure may vary among health care providers and hospitals. What happens after the procedure?  Your blood pressure, heart rate, breathing rate, and blood oxygen level will  be monitored until the medicines you were given have worn off.  You will receive fluids through an IV tube until your bowels start to work properly.  Once your bowels are working again, you will be given clear liquids first and then solid food as tolerated.  You will be given medicines to control your pain and nausea, if needed.  Do not drive for 24 hours if you were given a sedative. This information is not intended to replace advice given to you by your health care provider. Make sure you discuss any questions you have with your health care provider. Document Released: 08/25/2002 Document Revised: 03/05/2016 Document Reviewed: 03/05/2016 Elsevier Interactive Patient Education  [employer].

## 2017-10-16 ENCOUNTER — Telehealth: Payer: Self-pay | Admitting: *Deleted

## 2017-10-16 DIAGNOSIS — K56699 Other intestinal obstruction unspecified as to partial versus complete obstruction: Secondary | ICD-10-CM | POA: Insufficient documentation

## 2017-10-16 NOTE — Telephone Encounter (Signed)
FMLA 4 weeks, plan return to work date 12-30-17

## 2017-10-16 NOTE — Telephone Encounter (Signed)
Calling to confirm 4 weeks of FMLA?

## 2017-10-17 ENCOUNTER — Telehealth: Payer: Self-pay | Admitting: *Deleted

## 2017-10-17 MED ORDER — POLYETHYLENE GLYCOL 3350 17 GM/SCOOP PO POWD: ORAL | 0 refills | Status: DC

## 2017-10-17 MED ORDER — NEOMYCIN SULFATE 500 MG PO TABS: ORAL_TABLET | ORAL | 0 refills | Status: DC

## 2017-10-17 MED ORDER — METRONIDAZOLE 500 MG PO TABS: ORAL_TABLET | ORAL | 0 refills | Status: DC

## 2017-10-17 NOTE — Telephone Encounter (Signed)
Patient's surgery has been scheduled for 12-02-17 at [facility]. Dr. Hulda Marin will be assisting with this case.   The patient has been asked to complete a bowel prep with antibiotics. Prescriptions have been sent in today to [facility] on [address] in [place] per patient's request.  The patient will need to see Dr. Lemar Livings pre-operatively. This has been scheduled accordingly and patient aware of date and time. She verbalizes understanding.  Patient was instructed to call the office should she have further questions.

## 2017-11-10 ENCOUNTER — Other Ambulatory Visit: Payer: Self-pay | Admitting: General Surgery

## 2017-11-10 DIAGNOSIS — K56699 Other intestinal obstruction unspecified as to partial versus complete obstruction: Secondary | ICD-10-CM

## 2017-11-21 ENCOUNTER — Ambulatory Visit: Payer: BC Managed Care – PPO | Admitting: General Surgery

## 2017-11-21 ENCOUNTER — Encounter: Payer: Self-pay | Admitting: General Surgery

## 2017-11-21 VITALS — BP 120/68 | HR 72 | Resp 13 | Ht 62.0 in | Wt 135.0 lb

## 2017-11-21 DIAGNOSIS — K56699 Other intestinal obstruction unspecified as to partial versus complete obstruction: Secondary | ICD-10-CM | POA: Diagnosis not present

## 2017-11-21 NOTE — Progress Notes (Signed)
Patient ID: Casey KeelerWendelin J Rolland, female   DOB: 11/13/1960, 57 y.o.   MRN: [id number]  Chief Complaint  Patient presents with  . Pre-op Exam    HPI [name] is a 57 y.o. female here today for her pre op colectomy scheduled on 12/02/2017. Daughter, [name] is present at visit.  HPI  Past Medical History:  Diagnosis Date  . Diverticulosis   . GERD (gastroesophageal reflux disease)   . Osteoporosis    borderline  . Thyroid disease    hypothyriod    Past Surgical History:  Procedure Laterality Date  . BREAST SURGERY     sterotatic biopsy on right breast.  . COLONOSCOPY  05/25/2010  . COLONOSCOPY WITH PROPOFOL N/A 12/25/2016   Flexible sigmoidoscopy, post procedure barium enema showing narrowing without mucosal abnormality in the sigmoid.    Family History  Problem Relation Age of Onset  . Heart disease Father        congestive heart failure  . Hypothyroidism Sister   . Arthritis Sister        RA  . Diabetes Maternal Grandmother   . Hypothyroidism Maternal Grandmother   . Alzheimer's disease Paternal Grandmother   . Hypothyroidism Sister     Social History Social History   Tobacco Use  . Smoking status: Current Every Day Smoker    Packs/day: 0.25    Types: Cigarettes  . Smokeless tobacco: Never Used  . Tobacco comment: patient not thinking about it .  Substance Use Topics  . Alcohol use: No  . Drug use: No    No Known Allergies  Current Outpatient Medications  Medication Sig Dispense Refill  . ARMOUR THYROID 90 MG tablet Take 90 mg by mouth daily before breakfast.  3  . Ascorbic Acid (VITAMIN C) 1000 MG tablet Take 1,000 mg by mouth at bedtime.    . Calcium-Vitamin D-Vitamin K (CALCIUM + D + K PO) Take 1 tablet by mouth 2 (two) times daily. With lunch & at bedtime.    . Cholecalciferol (VITAMIN D3) 2000 units TABS Take 2,000 Units by mouth 2 (two) times daily. With lunch & at bedtime.    . clobetasol ointment (TEMOVATE) 0.05 % Apply 1 application  topically as needed.     Marland Kitchen. ibuprofen (ADVIL,MOTRIN) 200 MG tablet Take 200 mg by mouth 3 (three) times daily as needed (for pain/headaches.).    Marland Kitchen. metroNIDAZOLE (FLAGYL) 500 MG tablet Take one (1) tablet at 6 pm and one (1) tablet at 11 pm the evening prior to surgery. 2 tablet 0  . neomycin (MYCIFRADIN) 500 MG tablet Take two (2) tablets at 6 pm and two (2) tablets at 11 pm the evening prior to surgery. 4 tablet 0  . omeprazole (PRILOSEC OTC) 20 MG tablet Take 20 mg by mouth daily as needed (for acid reflux.).     Marland Kitchen. OVER THE COUNTER MEDICATION Patient states she is taking various other supplements/vitamins which have been held due to upcoming procedure--but does not wish to list these items.    . polyethylene glycol powder (GLYCOLAX/MIRALAX) powder 255 grams one bottle for bowel prep 255 g 0  . Probiotic Product (PROBIOTIC PO) Take 1 capsule by mouth daily.    . Wheat Dextrin (BENEFIBER) POWD Take 1 Scoop by mouth daily as needed (for stomach issues.).      No current facility-administered medications for this visit.     Review of Systems Review of Systems  Constitutional: Negative.   Respiratory: Negative.   Cardiovascular: Negative.  Blood pressure 120/68, pulse 72, resp. rate 13, height 5\' 2"  (1.575 m), weight 135 lb (61.2 kg).  Physical Exam Physical Exam  Constitutional: She is oriented to person, place, and time. She appears well-developed and well-nourished.  Cardiovascular: Normal rate, regular rhythm and normal heart sounds.  Pulmonary/Chest: Effort normal and breath sounds normal.  Abdominal: Soft. Bowel sounds are normal. There is no guarding.  Neurological: She is oriented to person, place, and time.  Skin: Skin is warm and dry.    Data Reviewed Prior CT exams, BE and sigmoidoscopy report.   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     Plan  Plans for surgery reviewed. Unless unexpected findings, doubt need for a temporary diverting stoma.    Scheduled for  Colectomy surgery on 12/02/2017. The patient is aware to call back for any questions or concerns.  Reviewed importance of early ambulation.   HPI, Physical Exam, Assessment and Plan have been scribed under the direction and in the presence of Donnalee Curry, MD.  Ples Specter, CMA  I have completed the exam and reviewed the above documentation for accuracy and completeness.  I agree with the above.  Museum/gallery conservator has been used and any errors in dictation or transcription are unintentional.  Donnalee Curry, M.D., F.A.C.S.  Merrily Pew Bena Kobel 11/22/2017, 8:04 PM

## 2017-11-21 NOTE — Patient Instructions (Signed)
colectomy scheduled on 12/02/2017.

## 2017-11-25 ENCOUNTER — Encounter
Admission: RE | Admit: 2017-11-25 | Discharge: 2017-11-25 | Disposition: A | Discharge status: Home / Self Care | Payer: BC Managed Care – PPO | Source: Ambulatory Visit | Attending: General Surgery | Admitting: General Surgery

## 2017-11-25 ENCOUNTER — Other Ambulatory Visit: Payer: Self-pay

## 2017-11-25 HISTORY — DX: Cardiac murmur, unspecified: R01.1

## 2017-11-25 HISTORY — DX: Headache: R51

## 2017-11-25 HISTORY — DX: Headache, unspecified: R51.9

## 2017-11-25 HISTORY — DX: Hypothyroidism, unspecified: E03.9

## 2017-11-25 NOTE — Patient Instructions (Signed)
Your procedure is scheduled on: 12-02-17 MONDAY Report to Same Day Surgery 2nd floor medical mall [facility](Medical Mall Entrance-take elevator on left to 2nd floor.  Check in with surgery information desk.) To find out your arrival time please call [phone] between 1PM - 3PM on 11-29-17 FRIDAY  Remember: Instructions that are not followed completely may result in serious medical risk, up to and including death, or upon the discretion of your surgeon and anesthesiologist your surgery may need to be rescheduled.    _x___ 1. Do not eat food after midnight the night before your procedure. NO GUM OR CANDY AFTER MIDNIGHT.  You may drink clear liquids up to 2 hours before you are scheduled to arrive at the hospital for your procedure.  Do not drink clear liquids within 2 hours of your scheduled arrival to the hospital.  Clear liquids include  --Water or Apple juice without pulp  --Clear carbohydrate beverage such as ClearFast or Gatorade  --Black Coffee or Clear Tea (No milk, no creamers, do not add anything to the coffee or Tea    __x__ 2. No Alcohol for 24 hours before or after surgery.   __x__3. No Smoking or e-cigarettes for 24 prior to surgery.  Do not use any chewable tobacco products for at least 6 hour prior to surgery   ____  4. Bring all medications with you on the day of surgery if instructed.    __x__ 5. Notify your doctor if there is any change in your medical condition     (cold, fever, infections).    x___6. On the morning of surgery brush your teeth with toothpaste and water.  You may rinse your mouth with mouth wash if you wish.  Do not swallow any toothpaste or mouthwash.   Do not wear jewelry, make-up, hairpins, clips or nail polish.  Do not wear lotions, powders, or perfumes. You may wear deodorant.  Do not shave 48 hours prior to surgery. Men may shave face and neck.  Do not bring valuables to the hospital.    [facility] is not responsible for any belongings or  valuables.               Contacts, dentures or bridgework may not be worn into surgery.  Leave your suitcase in the car. After surgery it may be brought to your room.  For patients admitted to the hospital, discharge time is determined by your treatment team.  _  Patients discharged the day of surgery will not be allowed to drive home.  You will need someone to drive you home and stay with you the night of your procedure.    Please read over the following fact sheets that you were given:   [facility] Preparing for Surgery and or MRSA Information   _x___ TAKE THE FOLLOWING MEDICATION THE MORNING OF SURGERY WITH A SMALL SIP OF WATER. These include:  1. ARMOUR THYROID  2. PRILOSEC (OMEPRAZOLE)  3. TAKE AN EXTRA PRILOSEC Sunday NIGHT BEFORE BED  4. DRINK YOUR PRE-SURGERY ENSURE ON THE WAY TO THE HOSPITAL  5. FOLLOW DR Rutherford NailBYRNETT'S PREP THAT YOU RECEIVED IN HIS OFFICE  6.  ____Fleets enema or Magnesium Citrate as directed.   _x___ Use CHG Soap or sage wipes as directed on instruction sheet   ____ Use inhalers on the day of surgery and bring to hospital day of surgery  ____ Stop Metformin and Janumet 2 days prior to surgery.    ____ Take 1/2 of usual insulin dose  the night before surgery and none on the morning surgery.   ____ Follow recommendations from Cardiologist, Pulmonologist or PCP regarding stopping Aspirin, Coumadin, Plavix ,Eliquis, Effient, or Pradaxa, and Pletal.  ____Stop Anti-inflammatories such as Advil, Aleve, Ibuprofen, Motrin, Naproxen, Naprosyn, Goodies powders or aspirin products. OK to take Tylenol    ____ Stop supplements until after surgery.     ____ Bring C-Pap to the hospital.

## 2017-11-27 ENCOUNTER — Encounter
Admission: RE | Admit: 2017-11-27 | Discharge: 2017-11-27 | Disposition: A | Discharge status: Home / Self Care | Payer: BC Managed Care – PPO | Source: Ambulatory Visit | Attending: General Surgery | Admitting: General Surgery

## 2017-11-27 DIAGNOSIS — Z01818 Encounter for other preprocedural examination: Secondary | ICD-10-CM | POA: Insufficient documentation

## 2017-11-27 LAB — TYPE AND SCREEN
ABO/RH(D): O POS
Antibody Screen: NEGATIVE

## 2017-12-01 MED ORDER — SODIUM CHLORIDE 0.9 % IV SOLN
1.0000 g | INTRAVENOUS | Status: AC
  Administered 2017-12-02: 1 g via INTRAVENOUS
  Filled 2017-12-01: qty 1

## 2017-12-02 ENCOUNTER — Inpatient Hospital Stay: Payer: BC Managed Care – PPO | Admitting: Anesthesiology

## 2017-12-02 ENCOUNTER — Encounter: Payer: Self-pay | Admitting: *Deleted

## 2017-12-02 ENCOUNTER — Encounter
Admission: RE | Disposition: A | Discharge status: Home Health | Payer: Self-pay | Source: Ambulatory Visit | Attending: General Surgery

## 2017-12-02 ENCOUNTER — Inpatient Hospital Stay
Admission: RE | Admit: 2017-12-02 | Discharge: 2017-12-09 | DRG: 330 | Disposition: A | Discharge status: Home Health | Payer: BC Managed Care – PPO | Source: Ambulatory Visit | Attending: General Surgery | Admitting: General Surgery

## 2017-12-02 ENCOUNTER — Other Ambulatory Visit: Payer: Self-pay

## 2017-12-02 DIAGNOSIS — J449 Chronic obstructive pulmonary disease, unspecified: Secondary | ICD-10-CM | POA: Diagnosis present

## 2017-12-02 DIAGNOSIS — K660 Peritoneal adhesions (postprocedural) (postinfection): Secondary | ICD-10-CM | POA: Diagnosis present

## 2017-12-02 DIAGNOSIS — K567 Ileus, unspecified: Secondary | ICD-10-CM | POA: Diagnosis not present

## 2017-12-02 DIAGNOSIS — K56699 Other intestinal obstruction unspecified as to partial versus complete obstruction: Secondary | ICD-10-CM

## 2017-12-02 DIAGNOSIS — K5792 Diverticulitis of intestine, part unspecified, without perforation or abscess without bleeding: Secondary | ICD-10-CM | POA: Diagnosis present

## 2017-12-02 DIAGNOSIS — K5732 Diverticulitis of large intestine without perforation or abscess without bleeding: Principal | ICD-10-CM | POA: Diagnosis present

## 2017-12-02 DIAGNOSIS — M810 Age-related osteoporosis without current pathological fracture: Secondary | ICD-10-CM | POA: Diagnosis present

## 2017-12-02 DIAGNOSIS — F17200 Nicotine dependence, unspecified, uncomplicated: Secondary | ICD-10-CM | POA: Diagnosis present

## 2017-12-02 DIAGNOSIS — K5790 Diverticulosis of intestine, part unspecified, without perforation or abscess without bleeding: Secondary | ICD-10-CM

## 2017-12-02 DIAGNOSIS — K219 Gastro-esophageal reflux disease without esophagitis: Secondary | ICD-10-CM | POA: Diagnosis present

## 2017-12-02 DIAGNOSIS — E039 Hypothyroidism, unspecified: Secondary | ICD-10-CM | POA: Diagnosis present

## 2017-12-02 HISTORY — PX: LAPAROSCOPIC SIGMOID COLECTOMY: SHX5928

## 2017-12-02 LAB — CREATININE, SERUM
Creatinine, Ser: 0.77 mg/dL (ref 0.44–1.00)
GFR calc Af Amer: >60 mL/min (ref >60)
GFR calc non Af Amer: >60 mL/min (ref >60)

## 2017-12-02 LAB — CBC
HCT: 35.1 % (ref 35.0–47.0)
Hemoglobin: 11.9 g/dL — ABNORMAL LOW (ref 12.0–16.0)
MCH: 29.3 pg (ref 26.0–34.0)
MCHC: 34 g/dL (ref 32.0–36.0)
MCV: 86.2 fL (ref 80.0–100.0)
PLATELETS: 415 10*3/uL (ref 150–440)
RBC: 4.07 MIL/uL (ref 3.80–5.20)
RDW: 14 % (ref 11.5–14.5)
WBC: 19.4 10*3/uL — ABNORMAL HIGH (ref 3.6–11.0)

## 2017-12-02 LAB — ABO/RH: ABO/RH(D): O POS

## 2017-12-02 SURGERY — COLECTOMY, SIGMOID, LAPAROSCOPIC
Anesthesia: General | Site: Abdomen | Wound class: Contaminated

## 2017-12-02 MED ORDER — MORPHINE SULFATE (PF) 2 MG/ML IV SOLN
2.0000 mg | INTRAVENOUS | Status: DC | PRN
Start: 2017-12-02 — End: 2017-12-08

## 2017-12-02 MED ORDER — SEVOFLURANE IN SOLN
RESPIRATORY_TRACT | Status: AC
  Filled 2017-12-02: qty 250

## 2017-12-02 MED ORDER — IBUPROFEN 400 MG PO TABS: 200.0000 mg | ORAL_TABLET | ORAL | Status: DC | PRN

## 2017-12-02 MED ORDER — DEXAMETHASONE SODIUM PHOSPHATE 10 MG/ML IJ SOLN
INTRAMUSCULAR | Status: DC | PRN
  Administered 2017-12-02: 10 mg via INTRAVENOUS

## 2017-12-02 MED ORDER — HYDROMORPHONE HCL 1 MG/ML IJ SOLN
INTRAMUSCULAR | Status: AC
  Filled 2017-12-02: qty 1

## 2017-12-02 MED ORDER — ENOXAPARIN SODIUM 40 MG/0.4ML ~~LOC~~ SOLN
40.0000 mg | SUBCUTANEOUS | Status: DC
  Administered 2017-12-03 – 2017-12-09 (×7): 40 mg via SUBCUTANEOUS
  Filled 2017-12-02 (×8): qty 0.4

## 2017-12-02 MED ORDER — MIDAZOLAM HCL 2 MG/2ML IJ SOLN
INTRAMUSCULAR | Status: DC | PRN
  Administered 2017-12-02: 4 mg via INTRAVENOUS

## 2017-12-02 MED ORDER — GABAPENTIN 300 MG PO CAPS
ORAL_CAPSULE | ORAL | Status: AC
  Filled 2017-12-02: qty 1

## 2017-12-02 MED ORDER — ENSURE PRE-SURGERY PO LIQD: 296.0000 mL | Freq: Once | ORAL | 0 refills | Status: AC

## 2017-12-02 MED ORDER — GABAPENTIN 300 MG PO CAPS: 300.0000 mg | ORAL_CAPSULE | ORAL | Status: AC

## 2017-12-02 MED ORDER — BUPIVACAINE-EPINEPHRINE 0.5% -1:200000 IJ SOLN
INTRAMUSCULAR | Status: DC | PRN
  Administered 2017-12-02: 30 mL

## 2017-12-02 MED ORDER — ALVIMOPAN 12 MG PO CAPS
12.0000 mg | ORAL_CAPSULE | ORAL | Status: AC
  Administered 2017-12-02: 12 mg via ORAL

## 2017-12-02 MED ORDER — ACETAMINOPHEN 10 MG/ML IV SOLN
1000.0000 mg | Freq: Four times a day (QID) | INTRAVENOUS | Status: AC
  Administered 2017-12-02 – 2017-12-03 (×4): 1000 mg via INTRAVENOUS
  Filled 2017-12-02 (×4): qty 100

## 2017-12-02 MED ORDER — THYROID 60 MG PO TABS
90.0000 mg | ORAL_TABLET | Freq: Every day | ORAL | Status: DC
  Administered 2017-12-03 – 2017-12-09 (×7): 90 mg via ORAL
  Filled 2017-12-02 (×7): qty 1

## 2017-12-02 MED ORDER — OMEPRAZOLE MAGNESIUM 20 MG PO TBEC: 20.0000 mg | DELAYED_RELEASE_TABLET | Freq: Every day | ORAL | Status: DC | PRN

## 2017-12-02 MED ORDER — PROPOFOL 10 MG/ML IV BOLUS
INTRAVENOUS | Status: DC | PRN
  Administered 2017-12-02: 150 mg via INTRAVENOUS
  Administered 2017-12-02: 50 mg via INTRAVENOUS

## 2017-12-02 MED ORDER — ACETAMINOPHEN 10 MG/ML IV SOLN
INTRAVENOUS | Status: DC | PRN
  Administered 2017-12-02: 1000 mg via INTRAVENOUS

## 2017-12-02 MED ORDER — CELECOXIB 200 MG PO CAPS
ORAL_CAPSULE | ORAL | Status: AC
  Filled 2017-12-02: qty 1

## 2017-12-02 MED ORDER — ONDANSETRON HCL 4 MG/2ML IJ SOLN
INTRAMUSCULAR | Status: DC | PRN
  Administered 2017-12-02 (×2): 4 mg via INTRAVENOUS

## 2017-12-02 MED ORDER — BUPIVACAINE-EPINEPHRINE (PF) 0.5% -1:200000 IJ SOLN
INTRAMUSCULAR | Status: AC
  Filled 2017-12-02: qty 30

## 2017-12-02 MED ORDER — LACTATED RINGERS IV SOLN
INTRAVENOUS | Status: DC
  Administered 2017-12-02 – 2017-12-05 (×4): via INTRAVENOUS

## 2017-12-02 MED ORDER — KETAMINE HCL 10 MG/ML IJ SOLN
INTRAMUSCULAR | Status: DC | PRN
  Administered 2017-12-02 (×2): 25 mg via INTRAVENOUS

## 2017-12-02 MED ORDER — KETOROLAC TROMETHAMINE 30 MG/ML IJ SOLN
INTRAMUSCULAR | Status: AC
  Filled 2017-12-02: qty 1

## 2017-12-02 MED ORDER — ACETAMINOPHEN 10 MG/ML IV SOLN
INTRAVENOUS | Status: AC
  Filled 2017-12-02: qty 100

## 2017-12-02 MED ORDER — ROCURONIUM BROMIDE 50 MG/5ML IV SOLN
INTRAVENOUS | Status: AC
  Filled 2017-12-02: qty 1

## 2017-12-02 MED ORDER — LIDOCAINE HCL (CARDIAC) PF 100 MG/5ML IV SOSY
PREFILLED_SYRINGE | INTRAVENOUS | Status: DC | PRN
  Administered 2017-12-02 (×2): 50 mg via INTRAVENOUS

## 2017-12-02 MED ORDER — DEXAMETHASONE SODIUM PHOSPHATE 10 MG/ML IJ SOLN
INTRAMUSCULAR | Status: AC
Start: 2017-12-02 — End: ?
  Filled 2017-12-02: qty 1

## 2017-12-02 MED ORDER — ALVIMOPAN 12 MG PO CAPS
12.0000 mg | ORAL_CAPSULE | Freq: Two times a day (BID) | ORAL | Status: DC
  Administered 2017-12-02 – 2017-12-03 (×3): 12 mg via ORAL
  Filled 2017-12-02 (×4): qty 1

## 2017-12-02 MED ORDER — MIDAZOLAM HCL 2 MG/2ML IJ SOLN
INTRAMUSCULAR | Status: AC
  Filled 2017-12-02: qty 4

## 2017-12-02 MED ORDER — PROPOFOL 10 MG/ML IV BOLUS
INTRAVENOUS | Status: AC
  Filled 2017-12-02: qty 40

## 2017-12-02 MED ORDER — KETOROLAC TROMETHAMINE 30 MG/ML IJ SOLN
INTRAMUSCULAR | Status: DC | PRN
  Administered 2017-12-02: 30 mg via INTRAVENOUS

## 2017-12-02 MED ORDER — SUGAMMADEX SODIUM 200 MG/2ML IV SOLN
INTRAVENOUS | Status: DC | PRN
  Administered 2017-12-02: 200 mg via INTRAVENOUS

## 2017-12-02 MED ORDER — FENTANYL CITRATE (PF) 100 MCG/2ML IJ SOLN
25.0000 ug | INTRAMUSCULAR | Status: DC | PRN
  Administered 2017-12-02 (×4): 25 ug via INTRAVENOUS

## 2017-12-02 MED ORDER — SODIUM CHLORIDE 0.9 % IV SOLN: 1.0000 g | INTRAVENOUS | Status: AC

## 2017-12-02 MED ORDER — KETAMINE HCL 50 MG/ML IJ SOLN
INTRAMUSCULAR | Status: AC
  Filled 2017-12-02: qty 10

## 2017-12-02 MED ORDER — PROMETHAZINE HCL 25 MG/ML IJ SOLN: 12.5000 mg | Freq: Four times a day (QID) | INTRAMUSCULAR | Status: DC | PRN

## 2017-12-02 MED ORDER — ROCURONIUM BROMIDE 100 MG/10ML IV SOLN
INTRAVENOUS | Status: DC | PRN
  Administered 2017-12-02: 50 mg via INTRAVENOUS
  Administered 2017-12-02 (×5): 10 mg via INTRAVENOUS

## 2017-12-02 MED ORDER — ONDANSETRON HCL 4 MG/2ML IJ SOLN
INTRAMUSCULAR | Status: AC
  Filled 2017-12-02: qty 2

## 2017-12-02 MED ORDER — GLYCOPYRROLATE 0.2 MG/ML IJ SOLN
INTRAMUSCULAR | Status: DC | PRN
  Administered 2017-12-02: 0.1 mg via INTRAVENOUS

## 2017-12-02 MED ORDER — LIDOCAINE HCL (PF) 2 % IJ SOLN
INTRAMUSCULAR | Status: AC
  Filled 2017-12-02: qty 10

## 2017-12-02 MED ORDER — ALVIMOPAN 12 MG PO CAPS: 12.0000 mg | ORAL_CAPSULE | ORAL | Status: AC

## 2017-12-02 MED ORDER — GLYCOPYRROLATE 0.2 MG/ML IJ SOLN
INTRAMUSCULAR | Status: AC
  Filled 2017-12-02: qty 1

## 2017-12-02 MED ORDER — LACTATED RINGERS IV SOLN
INTRAVENOUS | Status: DC
  Administered 2017-12-02 (×2): via INTRAVENOUS

## 2017-12-02 MED ORDER — PANTOPRAZOLE SODIUM 40 MG PO TBEC
40.0000 mg | DELAYED_RELEASE_TABLET | Freq: Every day | ORAL | Status: DC | PRN
Start: 2017-12-02 — End: 2017-12-09
  Administered 2017-12-07 – 2017-12-08 (×2): 40 mg via ORAL
  Filled 2017-12-02 (×2): qty 1

## 2017-12-02 MED ORDER — GABAPENTIN 300 MG PO CAPS
300.0000 mg | ORAL_CAPSULE | ORAL | Status: AC
  Administered 2017-12-02: 300 mg via ORAL

## 2017-12-02 MED ORDER — SUGAMMADEX SODIUM 200 MG/2ML IV SOLN
INTRAVENOUS | Status: AC
  Filled 2017-12-02: qty 2

## 2017-12-02 MED ORDER — ONDANSETRON HCL 4 MG/2ML IJ SOLN: 4.0000 mg | Freq: Once | INTRAMUSCULAR | Status: DC | PRN

## 2017-12-02 MED ORDER — OXYCODONE HCL 5 MG PO TABS
5.0000 mg | ORAL_TABLET | ORAL | Status: DC | PRN
Start: 2017-12-02 — End: 2017-12-08
  Administered 2017-12-03 – 2017-12-07 (×5): 5 mg via ORAL
  Filled 2017-12-02 (×5): qty 1

## 2017-12-02 MED ORDER — ALVIMOPAN 12 MG PO CAPS
ORAL_CAPSULE | ORAL | Status: AC
  Filled 2017-12-02: qty 1

## 2017-12-02 MED ORDER — HYDROMORPHONE HCL 1 MG/ML IJ SOLN
INTRAMUSCULAR | Status: DC | PRN
  Administered 2017-12-02 (×2): 0.5 mg via INTRAVENOUS
  Administered 2017-12-02: 1 mg via INTRAVENOUS

## 2017-12-02 MED ORDER — CELECOXIB 200 MG PO CAPS
200.0000 mg | ORAL_CAPSULE | ORAL | Status: AC
  Administered 2017-12-02: 200 mg via ORAL

## 2017-12-02 MED ORDER — PHENYLEPHRINE HCL 10 MG/ML IJ SOLN
INTRAMUSCULAR | Status: DC | PRN
  Administered 2017-12-02 (×7): 100 ug via INTRAVENOUS

## 2017-12-02 MED ORDER — FENTANYL CITRATE (PF) 100 MCG/2ML IJ SOLN
INTRAMUSCULAR | Status: AC
  Administered 2017-12-02: 25 ug via INTRAVENOUS
  Filled 2017-12-02: qty 2

## 2017-12-02 SURGICAL SUPPLY — 75 items
BRUSH SCRUB EZ  4% CHG (MISCELLANEOUS) ×2
BRUSH SCRUB EZ 4% CHG (MISCELLANEOUS) ×1 IMPLANT
CANISTER SUCT 1200ML W/VALVE (MISCELLANEOUS) ×3 IMPLANT
CANNULA DILATOR 10 W/SLV (CANNULA) ×2 IMPLANT
CANNULA DILATOR 10MM W/SLV (CANNULA) ×1
CHLORAPREP W/TINT 26ML (MISCELLANEOUS) ×3 IMPLANT
COVER CLAMP SIL LG PBX B (MISCELLANEOUS) ×3 IMPLANT
DRAPE LAP W/FLUID (DRAPES) ×3 IMPLANT
DRAPE STERI POUCH LG 24X46 STR (DRAPES) ×3 IMPLANT
DRSG OPSITE POSTOP 3X4 (GAUZE/BANDAGES/DRESSINGS) ×6 IMPLANT
DRSG OPSITE POSTOP 4X10 (GAUZE/BANDAGES/DRESSINGS) IMPLANT
DRSG OPSITE POSTOP 4X8 (GAUZE/BANDAGES/DRESSINGS) ×3 IMPLANT
ELECT BLADE 6.5 EXT (BLADE) ×3 IMPLANT
ELECT REM PT RETURN 9FT ADLT (ELECTROSURGICAL) ×3
ELECTRODE REM PT RTRN 9FT ADLT (ELECTROSURGICAL) ×1 IMPLANT
GAUZE SPONGE 4X4 12PLY STRL (GAUZE/BANDAGES/DRESSINGS) ×3 IMPLANT
GLOVE BIO SURGEON STRL SZ7 (GLOVE) ×9 IMPLANT
GLOVE BIO SURGEON STRL SZ7.5 (GLOVE) ×9 IMPLANT
GLOVE INDICATOR 8.0 STRL GRN (GLOVE) ×6 IMPLANT
GOWN STRL REUS W/ TWL LRG LVL3 (GOWN DISPOSABLE) ×6 IMPLANT
GOWN STRL REUS W/TWL LRG LVL3 (GOWN DISPOSABLE) ×12
HOLDER FOLEY CATH W/STRAP (MISCELLANEOUS) ×3 IMPLANT
KIT TURNOVER KIT A (KITS) ×3 IMPLANT
LABEL OR SOLS (LABEL) ×3 IMPLANT
NEEDLE 1/2 TROCAR (NEEDLE) ×3 IMPLANT
NS IRRIG 1000ML POUR BTL (IV SOLUTION) ×9 IMPLANT
NS IRRIG 500ML POUR BTL (IV SOLUTION) ×12 IMPLANT
OUTER TUBE INSULATED (MISCELLANEOUS) IMPLANT
PACK BASIN MAJOR ARMC (MISCELLANEOUS) ×3 IMPLANT
PACK COLON CLEAN CLOSURE (MISCELLANEOUS) ×3 IMPLANT
RELOAD PROXIMATE 75MM BLUE (ENDOMECHANICALS) ×3 IMPLANT
RELOAD STAPLER LINE PROX 60 GR (STAPLE) ×1 IMPLANT
RETRACTOR WOUND ALXS 18CM MED (MISCELLANEOUS) ×1 IMPLANT
RTRCTR WOUND ALEXIS O 18CM MED (MISCELLANEOUS) ×3
SET YANKAUER POOLE SUCT (MISCELLANEOUS) ×3 IMPLANT
SHEARS FOC LG CVD HARMONIC 17C (MISCELLANEOUS) IMPLANT
SHEARS HARMONIC 9CM CVD (BLADE) IMPLANT
SLEEVE ENDOPATH XCEL 5M (ENDOMECHANICALS) ×3 IMPLANT
SOL PREP PVP 2OZ (MISCELLANEOUS) ×3
SOLUTION PREP PVP 2OZ (MISCELLANEOUS) ×1 IMPLANT
SPONGE KITTNER 5P (MISCELLANEOUS) ×9 IMPLANT
SPONGE LAP 18X18 RF (DISPOSABLE) ×3 IMPLANT
SPONGE XRAY 4X4 16PLY STRL (MISCELLANEOUS) ×3 IMPLANT
STAPLER CIRCULAR 29MM (STAPLE) ×3 IMPLANT
STAPLER CUT CVD 40MM GREEN (STAPLE) ×3 IMPLANT
STAPLER ENDO ILS CVD 18 33 (STAPLE) ×3 IMPLANT
STAPLER PROXIMATE 75MM BLUE (STAPLE) ×3 IMPLANT
STAPLER RELOAD LINE PROX 60 GR (STAPLE) ×3
STAPLER SKIN PROX 35W (STAPLE) ×3 IMPLANT
SURGILUBE 2OZ TUBE FLIPTOP (MISCELLANEOUS) ×3 IMPLANT
SUT PDS AB 0 CT1 27 (SUTURE) ×12 IMPLANT
SUT PROLENE 0 CT 1 30 (SUTURE) IMPLANT
SUT PROLENE 3 0 SH DA (SUTURE) ×6 IMPLANT
SUT SILK 2 0 (SUTURE) ×2
SUT SILK 2-0 18XBRD TIE 12 (SUTURE) ×1 IMPLANT
SUT SILK 3 0 (SUTURE) ×2
SUT SILK 3-0 (SUTURE) ×12 IMPLANT
SUT SILK 3-0 18XBRD TIE 12 (SUTURE) ×1 IMPLANT
SUT VIC AB 0 CT1 36 (SUTURE) ×6 IMPLANT
SUT VIC AB 2-0 BRD 54 (SUTURE) ×3 IMPLANT
SUT VIC AB 2-0 CT1 27 (SUTURE) ×4
SUT VIC AB 2-0 CT1 TAPERPNT 27 (SUTURE) ×2 IMPLANT
SUT VIC AB 2-0 SH 27 (SUTURE) ×4
SUT VIC AB 2-0 SH 27XBRD (SUTURE) ×2 IMPLANT
SUT VIC AB 3-0 54X BRD REEL (SUTURE) ×1 IMPLANT
SUT VIC AB 3-0 BRD 54 (SUTURE) ×2
SUT VIC AB 3-0 SH 27 (SUTURE) ×10
SUT VIC AB 3-0 SH 27X BRD (SUTURE) ×5 IMPLANT
SYR BULB IRRIG 60ML STRL (SYRINGE) ×3 IMPLANT
TRAY FOLEY MTR SLVR 16FR STAT (SET/KITS/TRAYS/PACK) ×3 IMPLANT
TROCAR XCEL NON-BLD 11X100MML (ENDOMECHANICALS) ×3 IMPLANT
TROCAR XCEL NON-BLD 5MMX100MML (ENDOMECHANICALS) ×3 IMPLANT
TROCAR XCEL UNIV SLVE 11M 100M (ENDOMECHANICALS) ×3 IMPLANT
TUBING INSUF HEATED (TUBING) ×3 IMPLANT
WAFER FLANGE 1 3/4 SMALL GREEN (OSTOMY) ×3 IMPLANT

## 2017-12-02 NOTE — H&P (Signed)
No change in clinical history or exam.  For resection of sigmoid colon for chronic diverticular stricture.

## 2017-12-02 NOTE — Anesthesia Postprocedure Evaluation (Signed)
Anesthesia Post Note  Patient: [name]  Procedure(s) Performed: LAPAROSCOPIC ASSISTED COLECTOMY; LOOP ILEOSTOMY (N/A Abdomen)  Patient location during evaluation: PACU Anesthesia Type: General Level of consciousness: awake and alert and oriented Pain management: pain level controlled Vital Signs Assessment: post-procedure vital signs reviewed and stable Respiratory status: spontaneous breathing Cardiovascular status: blood pressure returned to baseline Anesthetic complications: no     Last Vitals:  Vitals:   12/02/17 1347 12/02/17 1352  BP: 129/65   Pulse: 84 88  Resp: 20 18  Temp:    SpO2: 96% 97%    Last Pain:  Vitals:   12/02/17 1352  TempSrc:   PainSc: Asleep                 Johann Santone

## 2017-12-02 NOTE — Anesthesia Post-op Follow-up Note (Signed)
Anesthesia QCDR form completed.        

## 2017-12-02 NOTE — Anesthesia Preprocedure Evaluation (Signed)
Anesthesia Evaluation  Patient identified by MRN, date of birth, ID band Patient awake    Reviewed: Allergy & Precautions, H&P , NPO status , Patient's Chart, lab work & pertinent test results  History of Anesthesia Complications Negative for: history of anesthetic complications  Airway Mallampati: III  TM Distance: >3 FB Neck ROM: limited    Dental  (+) Caps, Chipped   Pulmonary COPD, Current Smoker,           Cardiovascular Exercise Tolerance: Good (-) angina(-) Past MI negative cardio ROS  + Valvular Problems/Murmurs      Neuro/Psych  Headaches, negative neurological ROS  negative psych ROS   GI/Hepatic Neg liver ROS, GERD  Controlled,  Endo/Other  Hypothyroidism   Renal/GU negative Renal ROS  negative genitourinary   Musculoskeletal negative musculoskeletal ROS (+)   Abdominal   Peds negative pediatric ROS (+)  Hematology negative hematology ROS (+)   Anesthesia Other Findings Past Medical History: No date: Diverticulosis No date: GERD (gastroesophageal reflux disease) No date: Osteoporosis     Comment: borderline No date: Thyroid disease     Comment: hypothyriod  Past Surgical History: No date: BREAST SURGERY     Comment: sterotatic biopsy on right breast. 05/25/2010: COLONOSCOPY  BMI    Body Mass Index:  24.37 kg/m      Reproductive/Obstetrics negative OB ROS                             Anesthesia Physical  Anesthesia Plan  ASA: II  Anesthesia Plan: General   Post-op Pain Management:    Induction: Intravenous, Cricoid pressure planned and Rapid sequence  PONV Risk Score and Plan:   Airway Management Planned: Oral ETT  Additional Equipment:   Intra-op Plan:   Post-operative Plan: Extubation in OR  Informed Consent: I have reviewed the patients History and Physical, chart, labs and discussed the procedure including the risks, benefits and alternatives  for the proposed anesthesia with the patient or authorized representative who has indicated his/her understanding and acceptance.   Dental Advisory Given  Plan Discussed with: Anesthesiologist, CRNA and Surgeon  Anesthesia Plan Comments: (Patient consented for risks of anesthesia including but not limited to:  - adverse reactions to medications - damage to teeth, lips or other oral mucosa - sore throat or hoarseness - Damage to heart, brain, lungs or loss of life  Patient voiced understanding.)        Anesthesia Quick Evaluation

## 2017-12-02 NOTE — Op Note (Signed)
Preoperative diagnosis: Chronic sigmoid diverticulitis with stricture.  Postoperative diagnosis: Same.  Operative procedure: Laparoscopically assisted resection of the sigmoid and upper rectum, colocolostomy, diverting loop ileostomy.  Operating Surgeon: Donnalee CurryJeffrey Desean Heemstra, MD.  Assistant: Hulda Marinimothy Oaks, MD.  Anesthesia: General endotracheal, Marcaine 0.5% with 1 to 200,000 units of epinephrine, 30 cc.  Estimated blood loss, 225 cc.  Clinical note: This 57 year old woman is had a near 1 year history of abdominal pain and at least one episode of diverticular abscess.  She is failed to improve on conservative therapy and she was brought the option for planned sigmoid colectomy.  CT scan of March 2019 showed disease limited to the sigmoid colon.  The patient underwent formal bowel preparation with MiraLAX.  She received oral neomycin and Flagyl.  She received Invanz prior to the procedure.  She had SCD stockings for DVT prevention.  Operative note: The patient underwent general endotracheal anesthesia without difficulty.  Warming blanket was placed.  The patient was placed in stirrups and a Foley catheter placed by Dr. Thelma Bargeaks.  The abdomen was then cleansed with ChloraPrep and perineum cleansed with Betadine solution.  The wounds were draped.  A varies needle was placed with trans-umbilical incision.  After assuring intra-abdominal location with a hanging drop test the abdomen was insufflated with CO2 of 10 mmHg pressure.  A Step port was placed followed by a 10 mm dilator.  Inspection showed no evidence of injury from initial port placement.  The sigmoid colon was noted to be very firm and adherent to the lateral abdominal wall on the left.  Additional ports were placed 10 mm in the hypogastrium and 2-5 mm ports in the lower abdomen on each side.  The white line of Toldt on the left side of the abdomen was divided and the splenic flexure swept inferiorly.  Omental attachments were divided with the LigaSure  device.  Once the distal transverse colon could reach the pelvic brim attention was turned towards the diverticulitis.  As noted above there was incredible inflammatory changes in spite of the long time since her most recent episode of known acute diverticulitis.  As it was not possible to get any grasp or purchase on the colon it was elected to convert to the planned open portion of the procedure at this time.  An 8 cm incision was made extending down from the level of the umbilicus after infiltration with local anesthetic.  The skin was incised sharply and then the remaining dissection completed with electrocautery.  The fascia was opened and a medium Alexis wound protector placed.  The small bowel was freed from an adhesive band in the pelvis at the distal ileum and then the small bowel and right colon and cecum were swept superiorly and held in place with a wet lap and a retractor.  Attention was turned towards the inflammatory process which was impressive.  A tedious dissection, in part necessitating a trans-surgical assistant, was then undertaken.  This was done with cautery, LigaSure device and blunt dissection.  Bleeding vessels were controlled with 3-0 silk suture ligatures.  The dissection was made against the bowel wall.  2 small enterotomies were made which were controlled with 3-0 silk figure-of-eight sutures.  The inflammatory process extended down below the peritoneal reflection.  This was opened and using blunt blunt dissection the upper rectum was mobilized.  Even in this location well away from the severe disease in the sigmoid colon of the mesorectum was thickened.  Once this was cleared a contour stapler  was fired, x2, and at this point plans were for a stapled circular anastomosis from the perineum.  A 28 mm cartridge was chosen and the anvil sewn into the proximal colon which had been divided with a GIA stapler making use of a 3-0 Prolene pursestring suture around the tinea with a planned and  the side, Baker type anastomosis.  Attempts to pass the dilator per rectum were unsuccessful the rectal stump was opened and it was found there was some fixation posteriorly.  Additional mobilization was undertaken at this point it was felt that her high probability of injury to the sacral plexus venous structures.  Is there was no evidence of luminal narrowing, only angulation, it was elected to do a stapled anastomosis from above.  A 3-0 Prolene pursestring suture was placed across the upper rectum and the anvil from a 33 mm stapler placed.  The stapler was passed proximally through the original colotomy and fired.  An incomplete distal ring was obtained.  Inspection showed that there was a lateral area where the pursestring suture had come free.  This was repaired with interrupted 3-0 silk figure-of-eight sutures.  The anastomosis was watertight from above.  The colotomy was closed with a TA 60 with 4.8 mm staples.  The sigmoidoscope was then advanced from below and the pelvis filled with water.  The colon was insufflated and no leak was noted.  Of note, it was necessary to divide the mesocolon proximally almost to the origin of the IMA to obtain adequate length.  After this was completed it easily reached the pelvis.  There was essentially no mesenteric defect closed.  Due to the intense inflammatory response in the pelvis it was elected to complete a diverting loop ileostomy.  This was completed by making a 1-1/4 inch circular skin incision in the right lower quadrant removing the subtendinous fat.  A cruciate incision was made in the rectus fascia and the posterior rectus sheath exposed.  A loop of ileum was brought up and anchored circumferentially with interrupted 3-0 Vicryl sutures.  This was done both on the 6 superficial and deep side of the rectus fascia.  Sponge states instrument count was correct, the abdomen was irrigated with warm saline and the surgeon's gowns and gloves were changed.  The  peritoneum was closed with a running 2-0 Vicryl suture.  The fascia was closed with interrupted 0 figure-of-eight sutures.  Wound was irrigated, adipose layer approximated with 2-0 running Vicryl suture and the skin closed with staples.    The stoma was matured by opening it longitudinally and then sewing the edges of the bowel to the dermis with interrupted 3-0 Vicryl sutures.  Ileostomy appliance applied and the patient was taken recovery in stable condition.  Foley was removed at the end of the procedure.

## 2017-12-02 NOTE — Anesthesia Procedure Notes (Signed)
Procedure Name: Intubation Date/Time: 12/02/2017 7:42 AM Performed by: Sherol DadeMacMang, Jessilynn Taft H, CRNA Pre-anesthesia Checklist: Patient identified, Emergency Drugs available, Suction available, Timeout performed and Patient being monitored Patient Re-evaluated:Patient Re-evaluated prior to induction Oxygen Delivery Method: Circle system utilized Preoxygenation: Pre-oxygenation with 100% oxygen Induction Type: IV induction, Cricoid Pressure applied and Rapid sequence Ventilation: Mask ventilation without difficulty Laryngoscope Size: Miller and 2 Grade View: Grade I Tube type: Oral Tube size: 7.0 mm Number of attempts: 1 Airway Equipment and Method: Stylet Placement Confirmation: ETT inserted through vocal cords under direct vision,  positive ETCO2,  CO2 detector and breath sounds checked- equal and bilateral Secured at: 21 (@teeth ) cm Tube secured with: Tape Dental Injury: Teeth and Oropharynx as per pre-operative assessment  Comments: Modified RSI--cricoid pressure, easy mask vent x1 breath

## 2017-12-02 NOTE — Transfer of Care (Signed)
Immediate Anesthesia Transfer of Care Note  Patient: Judeth PorchWendelin J Arther  Procedure(s) Performed: LAPAROSCOPIC ASSISTED COLECTOMY; LOOP ILEOSTOMY (N/A Abdomen)  Patient Location: PACU  Anesthesia Type:General  Level of Consciousness: drowsy and patient cooperative  Airway & Oxygen Therapy: Patient Spontanous Breathing and Patient connected to face mask oxygen  Post-op Assessment: Report given to RN, Post -op Vital signs reviewed and stable and Patient moving all extremities  Post vital signs: Reviewed and stable  Last Vitals:  Vitals Value Taken Time  BP 143/83 12/02/2017  1:19 PM  Temp 37.4 C 12/02/2017  1:19 PM  Pulse 81 12/02/2017  1:21 PM  Resp 18 12/02/2017  1:21 PM  SpO2 100 % 12/02/2017  1:21 PM  Vitals shown include unvalidated device data.  Last Pain:  Vitals:   12/02/17 0611  TempSrc: Tympanic  PainSc: 0-No pain         Complications: No apparent anesthesia complications

## 2017-12-03 LAB — BASIC METABOLIC PANEL
Anion gap: 9 (ref 5–15)
BUN: 11 mg/dL (ref 6–20)
CO2: 22 mmol/L (ref 22–32)
CREATININE: 0.63 mg/dL (ref 0.44–1.00)
Calcium: 7.8 mg/dL — ABNORMAL LOW (ref 8.9–10.3)
Chloride: 105 mmol/L (ref 101–111)
GFR calc Af Amer: >60 mL/min (ref >60)
GLUCOSE: 115 mg/dL — AB (ref 65–99)
POTASSIUM: 3.7 mmol/L (ref 3.5–5.1)
Sodium: 136 mmol/L (ref 135–145)

## 2017-12-03 LAB — CBC
HCT: 33 % — ABNORMAL LOW (ref 35.0–47.0)
Hemoglobin: 11.2 g/dL — ABNORMAL LOW (ref 12.0–16.0)
MCH: 28.7 pg (ref 26.0–34.0)
MCHC: 33.8 g/dL (ref 32.0–36.0)
MCV: 84.8 fL (ref 80.0–100.0)
Platelets: 373 10*3/uL (ref 150–440)
RBC: 3.89 MIL/uL (ref 3.80–5.20)
RDW: 14 % (ref 11.5–14.5)
WBC: 18.2 10*3/uL — ABNORMAL HIGH (ref 3.6–11.0)

## 2017-12-03 NOTE — Consult Note (Signed)
WOC Nurse ostomy consult note Stoma type/location: RLQ ileostomy Stomal assessment/size: 1 and 1/8 inch when gentle traction is applied at 12 o'clock.  Oval at rest. Minimally budded, nearly flush with abdomen. Red, moist. Peristomal assessment: Deep crease at 2 and 3 o'clock, deep crease at 8-9 o'clock. Roll of adipose tissue folds over stoma obscuring patient's ability to see and care for ostomy.  Treatment options for stomal/peristomal skin: skin barrier ring Output: sweat in pouch, no stool Ostomy pouching: 1pc.flat with skin barrier ring Education provided:  Explained role of ostomy nurse. Taught GI A&P and stoma creation. Explained stoma characteristics (budded, flush, color, texture, care) Demonstrated pouch change (cutting new skin barrier, measuring stoma, cleaning peristomal skin and stoma, use of barrier ring) Education on emptying when 1/3 to 1/2 full and how to empty Demonstrated use of toilet paper wick to clean tail closure of ostomy pouching system.  Answered patient/family questions: Mother and 2 sisters in room.  Mother is likely to be her daughter's support, sisters were a bit squeamish.  Patient able to perform Lock and Roll closure, mother performs lock and roll closure.  Enrolled patient in [employer] DC program: Yes, today.  4 1-piece flat pouches, 4 rings requested.   [facility]  [facility].  If you agree, please order/arrange with Case Manager.  WOC nursing team will follow, and will remain available to this patient, the nursing, surgical and medical teams.   Thanks, Ladona MowLaurie Raenah Murley, MSN, RN, GNP, Hans EdenCWOCN, CWON-AP, FAAN  Pager# [phone]

## 2017-12-03 NOTE — Progress Notes (Signed)
Afebrile. BP low normal. Patient with minimal pain. Reports several loose stools per rectum.  Lungs: Clear.  Inspirex at 750. Cardio: RR. ABD: Non-disteneded, soft. Stoma: some retraction, pink. No output. Extrem:  Soft. Labs: HGB 11.2, 1204 preop. WBC: 18.2. Renal panel fine.  IMP: Doing well post sigmoid resection. Plan: Full liquids for comfort. Ambulation. Stoma nurse assessment.  Will need plate with karaya pad for fit.

## 2017-12-04 LAB — BASIC METABOLIC PANEL
ANION GAP: 11 (ref 5–15)
BUN: 7 mg/dL (ref 6–20)
CHLORIDE: 102 mmol/L (ref 101–111)
CO2: 22 mmol/L (ref 22–32)
Calcium: 7.7 mg/dL — ABNORMAL LOW (ref 8.9–10.3)
Creatinine, Ser: 0.43 mg/dL — ABNORMAL LOW (ref 0.44–1.00)
GFR calc non Af Amer: >60 mL/min (ref >60)
GLUCOSE: 73 mg/dL (ref 65–99)
POTASSIUM: 3.7 mmol/L (ref 3.5–5.1)
Sodium: 135 mmol/L (ref 135–145)

## 2017-12-04 LAB — CBC
HEMATOCRIT: 31.5 % — AB (ref 35.0–47.0)
HEMOGLOBIN: 10.6 g/dL — AB (ref 12.0–16.0)
MCH: 28.9 pg (ref 26.0–34.0)
MCHC: 33.7 g/dL (ref 32.0–36.0)
MCV: 86 fL (ref 80.0–100.0)
Platelets: 332 10*3/uL (ref 150–440)
RBC: 3.67 MIL/uL — AB (ref 3.80–5.20)
RDW: 14.5 % (ref 11.5–14.5)
WBC: 19.3 10*3/uL — AB (ref 3.6–11.0)

## 2017-12-04 LAB — SURGICAL PATHOLOGY

## 2017-12-04 MED ORDER — ACETAMINOPHEN 325 MG PO TABS
650.0000 mg | ORAL_TABLET | ORAL | Status: DC
  Administered 2017-12-04 – 2017-12-08 (×20): 650 mg via ORAL
  Filled 2017-12-04 (×21): qty 2

## 2017-12-04 NOTE — Progress Notes (Signed)
Afebrile, vital signs stable.  Minimal p.o.  Still passing prep from preop via rectum.  Ileostomy working.  Chest: Clear.  Cardiac: Regular rhythm.  Abdomen: Minimal distention, good bowel sounds, soft.  Wound: Clean and dry.  Insprirex: : 750 with encouragement.  White count remains elevated 19,300, hemoglobin mild fall to 10.6.  Electrolytes normal.  Creatinine 0.43.  Urine output adequate, will increase IV fluids due to stoma output.  All in all, doing well.  Stoma nurse has been working with the patient.  Anticipate discharge in 48-72 hours based on ability to initiate oral intake.

## 2017-12-04 NOTE — Progress Notes (Signed)
Initial Nutrition Assessment  DOCUMENTATION CODES:   Not applicable  INTERVENTION:   - Recommend Magic cup BID with meals, each supplement provides 290 kcal and 9 grams of protein   NUTRITION DIAGNOSIS:   Increased nutrient needs related to post-op healing as evidenced by estimated needs.  GOAL:   Patient will meet greater than or equal to 90% of their needs  MONITOR:   Diet advancement, PO intake, Labs, I & O's, Skin, Weight trends  REASON FOR ASSESSMENT:   Malnutrition Screening Tool    ASSESSMENT:   57 y/o female with h/o diverticulitis admitted for scheduled laparoscopically assisted resection of the sigmoid and upper rectum, colocolostomy, diverting loop ileostomy 6/17.  [name] sleeping soundly at time of visit. Spoke with [name]'s husband.  Per [name]'s husband, [name] has been progressively losing weight over the past 3 years. [name]'s husband reports [name] has lost 50 lbs during this timeframe. Weight history in chart PTA appears to be stated weights only. Unsure of accuracy.  Per [name]'s husband, [name] eats 3 very small meals daily. A meal might include "a couple bites of food." [name]'s husband states that [name] will often "force herself to eat because she knows she needs to." [name]'s husband reports that [name]'s UBW is 180 lbs and that she last weighed this over 3 years ago.  [name]'s husband believes [name]'s appetite is poor due to pain from diverticulitis flares. [name] states that the flares have been consistent over the course of the past 3 years.  [name]'s husband reports that [name] does not like Ensure Enlive oral nutrition supplements but may be willing to try [employer] GroupMagic Cup. RD recommends ordering Magic Cup BID with lunch and dinner meal trays to maximize kcal and protein intake.  Meal Completion: 30-100%  Medications reviewed and include: lactated ringers  Labs reviewed: creatinine 0.43 (L), calcium 7.7 (L), hemoglobin 10.6 (L), HCT 31.5 (L)  Stool output: 1075 ml since 0700 this morning UOP: 375 ml since 0700  this morning  NUTRITION - FOCUSED PHYSICAL EXAM:  Deferred at this time per [name]'s husband as [name] was sleeping at time of visit.  Diet Order:   Diet Order           Diet full liquid Room service appropriate? Yes; Fluid consistency: Thin  Diet effective now          EDUCATION NEEDS:   No education needs have been identified at this time  Skin:  Skin Assessment: Skin Integrity Issues: Skin Integrity Issues:: Incisions Incisions: closed surgical incision to abdomen  Last BM:  12/04/17 ileostomy with 650 ml output today  Height:   Ht Readings from Last 1 Encounters:  12/02/17 5\' 4"  (1.626 m)    Weight:   Wt Readings from Last 1 Encounters:  12/02/17 133 lb 12.8 oz (60.7 kg)    Ideal Body Weight:  54.5 kg  BMI:  Body mass index is 22.97 kg/m.  Estimated Nutritional Needs:   Kcal:  1700-1900 kcal/day  Protein:  80-95 grams/day  Fluid:  1.7-1.9 L/day    Earma ReadingKate Jablonski Duval Macleod, MS, RD, LDN Pager: [phone] Weekend/After Hours: [phone]

## 2017-12-04 NOTE — Consult Note (Signed)
WOC Nurse ostomy follow up Stoma type/location: RLQ ileostomy Stomal assessment/size: 1 and 1/8 inch when gentle traction is applied at 12 o'clock.  Oval at rest. Minimally budded, nearly flush with abdomen. Red, moist. Peristomal assessment: Not seen today.  Pouch applied yesterday is intact. Treatment options for stomal/peristomal skin: Skin barrier ring in place Output: brown liquid effluent Ostomy pouching: 1pc. Flat pouch with skin barrier ring  Education provided:  Discussed bathing, diet, medication use, diarrhea, dehydration, B.R.A.T. diet. Changing pouch in the morning before having anything to eat or drink. Discussed food blockage   Answered patient/family questions.    Enrolled patient in [employer] Discharge program: Yes, on 6/18.  Called [employer] today to add 1-piece soft convexity to supplies.  [facility]  [facility].  If you agree, please order/arrange with Case Manager.  WOC nursing team will follow, and will remain available to this patient, the nursing, surgical and medical teams.  Thanks, Ladona MowLaurie Gentry Pilson, MSN, RN, GNP, Hans EdenCWOCN, CWON-AP, FAAN  Pager# [phone]

## 2017-12-04 NOTE — Care Management (Signed)
RNCM attempted to meet with patient to discuss home health services at discharge.  Patient is sleeping soundly at this time.  RNCM informed husband that RNCM would return to discuss disposition

## 2017-12-05 MED ORDER — ONDANSETRON HCL 4 MG PO TABS
4.0000 mg | ORAL_TABLET | ORAL | Status: DC | PRN
  Administered 2017-12-05 – 2017-12-07 (×2): 4 mg via ORAL
  Filled 2017-12-05 (×2): qty 1

## 2017-12-05 MED ORDER — BOOST / RESOURCE BREEZE PO LIQD CUSTOM
1.0000 | Freq: Three times a day (TID) | ORAL | Status: DC
Start: 2017-12-05 — End: 2017-12-09
  Administered 2017-12-05 – 2017-12-07 (×5): 1 via ORAL

## 2017-12-05 MED ORDER — KCL IN DEXTROSE-NACL 20-5-0.45 MEQ/L-%-% IV SOLN
INTRAVENOUS | Status: DC
  Administered 2017-12-05 (×2): via INTRAVENOUS
  Filled 2017-12-05 (×4): qty 1000

## 2017-12-05 MED ORDER — IBUPROFEN 400 MG PO TABS
400.0000 mg | ORAL_TABLET | ORAL | Status: DC | PRN
  Administered 2017-12-08 – 2017-12-09 (×6): 400 mg via ORAL
  Filled 2017-12-05 (×7): qty 1

## 2017-12-05 NOTE — Progress Notes (Signed)
AVSS. Minimal pain. Stool frequency per rectum decreasing. Voiding well. U/O up w/ increased IV fluids overnight. Ileostomy functioning well. Eating a little, only grits so far. Has some other things she would be willing to try.  Lungs: Clear. Cardio: RR. ABD: Mild distension, soft, non-tender. BS+. Wound: Clean. Calves: Soft. Will change IV fluids to include so dextrose.  Will ask dietician to see re: preference.  She is willing to try strawberry boost as well.  Recheck labs in AM.

## 2017-12-05 NOTE — Progress Notes (Signed)
Nauseated today after oatmeal. No vomiting. Minimal pain. Stoma functioning well. Less stool per rectum. Ambulated well. Exam: T: 100.1. ABD: Soft, BS+. No focal tenderness.  Wound: Clean. IMP: Ileus, possible occult infection. Plan: Observation. Hold po to liquids at present. Recheck labs in AM.

## 2017-12-05 NOTE — Progress Notes (Signed)
Had patient empty Ileostomy bag and clean end of bag with tissue and feel the "gripper" closure on the end; able to return demonstration well, while up in the BR. Windy Carinaurner,Avish Torry K, RN 1:56 AM 12/05/2017

## 2017-12-05 NOTE — Progress Notes (Signed)
Nutrition Brief Note  Spoke with pt to obtain oral nutrition supplement preferences per discussion with Dr. Lemar LivingsByrnett as pt's PO intake remains poor. Last meal completion charted yesterday morning as 30%.  Pt states that she would like to continue receiving Boost Breeze TID that Dr. Lemar LivingsByrnett ordered this morning. RD let pt know that we have wild berry flavor but do not have strawberry flavor. Pt agreeable to trying wild berry flavor and thinks she would do better with a clear liquid supplement rather than a strawberry Ensure Enlive.  Pt declined Magic Cup with meals. RD ordered strawberry ice cream to come TID with meals.   Earma ReadingKate Jablonski Danne Scardina, MS, RD, LDN Pager: [phone] Weekend/After Hours: [phone]

## 2017-12-05 NOTE — Consult Note (Signed)
WOC Nurse ostomy follow up Stoma type/location: RLQ ileostomy Stomal assessment/size: 1 and 1/8 inch when gentle traction is applied at 12 o'clock. Oval at rest. Minimally budded, nearly flush with abdomen. Red, moist. (viewed through pouch) Peristomal assessment: Not seen today Treatment options for stomal/peristomal skin: skin barrier ring Output: brown/green liquid effluent Ostomy pouching: 1pc.flat with skin barrier ring.  Present pouching system has been in place since Tuesday,  48 hours.  Will plan for pouch change tomorrow. Education provided:  Extended session with daughter, mother and patient. Patient cut out skin barrier. Pouch change procedure reviewed with family.  Patient cannot see stoma.  Is emptying independently.  Will plan for pouch change tomorrow with one of my associates.   HHRN has been ordered and Case Management to assist with those arrangements. Diet education continues including need for protein and importance of chewing food carefully and drinking plenty of fluids to stay hydrated.  Enrolled patient in [employer] Dis[employer] program: Yes.  WOC nursing team will follow, and will remain available to this patient, the nursing and medical teams.   Thanks, Ladona MowLaurie Oluwateniola Leitch, MSN, RN, GNP, Hans EdenCWOCN, CWON-AP, FAAN  Pager# [phone]

## 2017-12-05 NOTE — Care Management (Signed)
Patient status post  Laparoscopically assisted resection of the sigmoid and upper rectum, colocolostomy, diverting loop ileostomy. Patient lives at home with husband.  PCP [place].  Patient denies issues obtaining medication or issues with transportation. Patient agreeable to home health for ostomy care and education.  Patient states she does not have a preference of agency.  [facility].  RNCM following

## 2017-12-06 LAB — CBC WITH DIFFERENTIAL/PLATELET
BASOS PCT: 0 %
Basophils Absolute: 0 10*3/uL (ref 0–0.1)
EOS ABS: 0 10*3/uL (ref 0–0.7)
EOS PCT: 0 %
HCT: 32 % — ABNORMAL LOW (ref 35.0–47.0)
Hemoglobin: 10.9 g/dL — ABNORMAL LOW (ref 12.0–16.0)
Lymphocytes Relative: 6 %
Lymphs Abs: 0.6 10*3/uL — ABNORMAL LOW (ref 1.0–3.6)
MCH: 29.1 pg (ref 26.0–34.0)
MCHC: 34.2 g/dL (ref 32.0–36.0)
MCV: 85 fL (ref 80.0–100.0)
Monocytes Absolute: 0.4 10*3/uL (ref 0.2–0.9)
Monocytes Relative: 4 %
Neutro Abs: 9.9 10*3/uL — ABNORMAL HIGH (ref 1.4–6.5)
Neutrophils Relative %: 90 %
PLATELETS: 366 10*3/uL (ref 150–440)
RBC: 3.76 MIL/uL — AB (ref 3.80–5.20)
RDW: 14.3 % (ref 11.5–14.5)
WBC: 10.9 10*3/uL (ref 3.6–11.0)

## 2017-12-06 LAB — URINALYSIS, ROUTINE W REFLEX MICROSCOPIC
BILIRUBIN URINE: NEGATIVE
Bacteria, UA: NONE SEEN
GLUCOSE, UA: NEGATIVE mg/dL
Ketones, ur: 20 mg/dL — AB
LEUKOCYTES UA: NEGATIVE
Nitrite: NEGATIVE
PH: 6 (ref 5.0–8.0)
Protein, ur: NEGATIVE mg/dL
SPECIFIC GRAVITY, URINE: 1.013 (ref 1.005–1.030)

## 2017-12-06 LAB — BASIC METABOLIC PANEL
ANION GAP: 10 (ref 5–15)
CO2: 25 mmol/L (ref 22–32)
Calcium: 7.5 mg/dL — ABNORMAL LOW (ref 8.9–10.3)
Chloride: 101 mmol/L (ref 101–111)
Creatinine, Ser: 0.38 mg/dL — ABNORMAL LOW (ref 0.44–1.00)
Glucose, Bld: 123 mg/dL — ABNORMAL HIGH (ref 65–99)
POTASSIUM: 3.2 mmol/L — AB (ref 3.5–5.1)
SODIUM: 136 mmol/L (ref 135–145)

## 2017-12-06 MED ORDER — KCL IN DEXTROSE-NACL 40-5-0.45 MEQ/L-%-% IV SOLN
INTRAVENOUS | Status: DC
  Administered 2017-12-06 – 2017-12-08 (×3): via INTRAVENOUS
  Filled 2017-12-06 (×4): qty 1000

## 2017-12-06 MED ORDER — POTASSIUM CHLORIDE CRYS ER 10 MEQ PO TBCR
10.0000 meq | EXTENDED_RELEASE_TABLET | Freq: Two times a day (BID) | ORAL | Status: DC
  Administered 2017-12-06 – 2017-12-09 (×7): 10 meq via ORAL
  Filled 2017-12-06 (×7): qty 1

## 2017-12-06 NOTE — Consult Note (Signed)
Explained role of ostomy nurse and creation of stoma  Explained stoma characteristics (budded, flush, color, texture, care). Slightly budded, red, moist, sutures intact, slightly oval shape and measures 1 inch. Demonstrated pouch change (cutting new skin barrier, measuring stoma, cleaning peristomal skin and stoma, use of barrier ring).  [facility]  Teaching completed with patient, one of her daughters, spouse and her mother.  All family members practiced opening and closing pouch.  Many appropriate questions asked and answered to their expressed satisfaction and ability to verbalize understanding. Education on emptying when 1/3 to 1/2 full and how to empty Demonstrated "burping" flatus from pouch Demonstrated use of wick to clean spout  Discussed bathing, diet, gas, medication use, constipation, diarrhea, dehydration  Discussed food blockage   Patient is to receive home health RN visits upon discharge. Additional supplies at the bedside.  Helmut MusterSherry Kasean Denherder, RN, MSN, CWOCN, CNS-BC, pager [phone]

## 2017-12-06 NOTE — Progress Notes (Signed)
Tmax 100.7 last night. Resolved overnight. Feeling well. Reports some back pain, likely from the bed, but when she has a UTI this is how it will present. Had a catheter during surgery only.  Lungs: Clear. Cardio: RR. ABD: Mild distension. BS+. Soft, non-tender. Wound: Clean. Extrem: Soft. Labs: K+: 3.2, lytes otherwise OK. HGB up at 10.9, WBC down to 10.9 with left shift.  Platelets: Normal at 366 (no rise). Doing well. Plan: Diet as tolerated. Check clean catch urine. Continue ambulation.

## 2017-12-07 LAB — URINE CULTURE: CULTURE: NO GROWTH

## 2017-12-07 NOTE — Progress Notes (Signed)
Patient ID: [name], female   DOB: 07/25/1960, 57 y.o.   MRN: [id number]     SURGICAL PROGRESS NOTE   Hospital Day(s): 5.   Post op day(s): 5 Days Post-Op.   Interval History: Patient seen and examined, no acute events or new complaints overnight. Patient reports continue with back pain. She refers that her UTI's usually start this way and the urinalysis are usually negative with a positive culture. Culture is in process. Patient tolerating small feeding of soft diet but still not eating well. Denies vomiting.   Vital signs in last 24 hours: [min-max] current  Temp:  [97.7 F (36.5 C)-99.1 F (37.3 C)] 99.1 F (37.3 C) (06/22 0517) Pulse Rate:  [64-82] 81 (06/22 0517) Resp:  [16-19] 18 (06/22 0517) BP: (112-132)/(49-67) 132/67 (06/22 0517) SpO2:  [98 %-100 %] 98 % (06/22 0517)     Height: 5\' 4"  (162.6 cm) Weight: 60.7 kg (133 lb 12.8 oz) BMI (Calculated): 22.96   Ostomy output in last 24 hours: 650 mL  Physical Exam:  Constitutional: alert, cooperative and no distress  Respiratory: breathing non-labored at rest  Cardiovascular: regular rate and sinus rhythm  Gastrointestinal: soft, non-tender, and non-distended. Ostomy pink and patent. Wound with dressing clean.   Labs:  CBC Latest Ref Rng & Units 12/06/2017 12/04/2017 12/03/2017  WBC 3.6 - 11.0 K/uL 10.9 19.3(H) 18.2(H)  Hemoglobin 12.0 - 16.0 g/dL 10.9(L) 10.6(L) 11.2(L)  Hematocrit 35.0 - 47.0 % 32.0(L) 31.5(L) 33.0(L)  Platelets 150 - 440 K/uL 366 332 373   CMP Latest Ref Rng & Units 12/06/2017 12/04/2017 12/03/2017  Glucose 65 - 99 mg/dL 409(W123(H) 73 119(J115(H)  BUN 6 - 20 mg/dL <4(N<5(L) 7 11  Creatinine 0.44 - 1.00 mg/dL 8.29(F0.38(L) 6.21(H0.43(L) 0.860.63  Sodium 135 - 145 mmol/L 136 135 136  Potassium 3.5 - 5.1 mmol/L 3.2(L) 3.7 3.7  Chloride 101 - 111 mmol/L 101 102 105  CO2 22 - 32 mmol/L 25 22 22   Calcium 8.9 - 10.3 mg/dL 7.5(L) 7.7(L) 7.8(L)   Imaging studies: No new pertinent imaging studies  Assessment/Plan:  57 y.o. female  with history of diverticulitis 5 Days Post-Op s/p laparoscopic assisted resection of the sigmoid and upper rectum with anastomosis and protective loop ileostomy. - Recovering slowly. Still with significant weakness. - Low grade fever yesterday, no chills.   Back pain - Usual presentation of this patient for frequent UTI - Culture in process. Will not start antibiotic until culture are final.   Receiving DVT prophylaxis with Lovenox Encourage to ambulate with assistance Encourage incentive spirometer  Gae GallopEdgardo Cintrn-Daz, MD

## 2017-12-08 MED ORDER — ACETAMINOPHEN 325 MG PO TABS: 650.0000 mg | ORAL_TABLET | ORAL | Status: DC | PRN

## 2017-12-08 NOTE — Progress Notes (Addendum)
Patient has only complained of back pain and received Ibuprofen twice during the shift. Ileostomy is producing liquid stool. During the shift, she has drank a total of two 16 ounce bottles of water. She has also eaten small amounts throughout the shift. Dressings to abdominal incisions were changed per order. She has ambulated multiple laps around the nurses station this shift. Also discussed ostomy care and she continues to empty her own ostomy bag.

## 2017-12-08 NOTE — Progress Notes (Signed)
AVSS. PO still limiting factor for d/c. Some nausea yesterday. Reports sensitivity to smells. No abdominal pain. Has yet to change stoma plate herself under care of the ostomy nurse. Lungs: Clear. Cardio: RR. ABD: Soft, non-tender. Minimal distension. Wound: Clean and dry. Extrem: Soft, non-tender. Stoma functioning well. Plan: Work on po diet. Home when able to change stoma plate.

## 2017-12-09 LAB — CREATININE, SERUM: CREATININE: 0.44 mg/dL (ref 0.44–1.00)

## 2017-12-09 NOTE — Progress Notes (Signed)
Nutrition Follow Up Note   DOCUMENTATION CODES:   Not applicable  INTERVENTION:   Discontinue Boost Breeze as pt reports that she does not like these  Add Vital Cuisine BID, each supplement provides 520kcal and 22g of protein.   NUTRITION DIAGNOSIS:   Increased nutrient needs related to post-op healing as evidenced by increased estimated needs.  GOAL:   Patient will meet greater than or equal to 90% of their needs  -progressing   MONITOR:   PO intake, Supplement acceptance, Labs, Weight trends, Skin, I & O's  ASSESSMENT:   57 y/o female with h/o diverticulitis admitted for scheduled laparoscopically assisted resection of the sigmoid and upper rectum, colocolostomy, diverting loop ileostomy 6/17.   Spoke with pt's husband today. Husband reports pt with continued poor appetite and oral intake but he feels pt's appetite is slowly improving. Pt eating some peanut butter and crackers this monring.  Pt does not like the Boost Breeze and has been refusing the majority of them. RD offered up a multitude of supplements and snacks; pt declines all nutrition interventions at this time. RD will add Vital Cuisine to meal trays; this is a "milkshake like" supplement that pt may enjoy. Pt likely at risk for refeeding syndrome; recommend monitor K, Mg, and P labs once oral intake improves. No new weight since admit; recommend obtain new weight. Consider NGT placement and nutrition support if pt's oral intake continues to be poor as she has been without adequate intake for > 7 days.   Medications reviewed and include: lovenox, KCl, armour  Labs reviewed:   Diet Order:   Diet Order           DIET SOFT Room service appropriate? Yes; Fluid consistency: Thin  Diet effective now         EDUCATION NEEDS:   No education needs have been identified at this time  Skin:  Incisions: closed surgical incision to abdomen  Last BM:  6/24- type 7 via ostomy   Height:   Ht Readings from Last 1  Encounters:  12/02/17 5\' 4"  (1.626 m)    Weight:   Wt Readings from Last 1 Encounters:  12/02/17 133 lb 12.8 oz (60.7 kg)    Ideal Body Weight:  54.5 kg  BMI:  Body mass index is 22.97 kg/m.  Estimated Nutritional Needs:   Kcal:  1500-1800kcal/day   Protein:  67-79g/day   Fluid:  >1.7L/day   Betsey Holidayasey Darlene Brozowski MS, RD, LDN Pager #- [phone] Office#- [phone] After Hours Pager: [phone]

## 2017-12-09 NOTE — Progress Notes (Signed)
Discharge instructions reviewed with patient and husband.  Understanding was verbalized and all questions were answered.  Staples removed per MD order.  Patient discharged in stable condition by wheelchair escorted by nursing staff.

## 2017-12-09 NOTE — Consult Note (Addendum)
WOC Nurse ostomy follow up Surgical team following for assessment and plan of car for abd wound. Stoma type/location: Ileostomy Stomal assessment/size: 1 1/8 inches, flush with skin level, located in a deep crease at 3:00 o'clock and 9 o'clock Peristomal assessment:  Intact skin surrounding Treatment options for stomal/peristomal skin: Barrier ring to attempt to maintain a seal Output: Mod amt liquid brown stool Ostomy pouching: 1pc  Education provided: Pt states she is independently emptying her pouch.  Reviewed pouching routine and ordering supplies.  Pt was able to apply pouch and barrier ring without assistance by looking in the mirror. Pt and husband ask appropriate questions.  5 sets of barrier rings and pouches left in the room for discharge home. Enrolled patient in [employer] Dis[facility] program: Yes Cammie Mcgeeawn Abdulrahman Bracey MSN, RN, [place], [place], [place] [phone]

## 2017-12-17 ENCOUNTER — Encounter: Payer: Self-pay | Admitting: General Surgery

## 2017-12-17 ENCOUNTER — Ambulatory Visit (INDEPENDENT_AMBULATORY_CARE_PROVIDER_SITE_OTHER): Payer: BC Managed Care – PPO | Admitting: General Surgery

## 2017-12-17 ENCOUNTER — Other Ambulatory Visit
Admission: RE | Admit: 2017-12-17 | Discharge: 2017-12-17 | Disposition: A | Discharge status: Home / Self Care | Payer: BC Managed Care – PPO | Source: Ambulatory Visit | Attending: General Surgery | Admitting: General Surgery

## 2017-12-17 VITALS — BP 108/62 | HR 66 | Resp 12 | Ht 65.0 in | Wt 122.0 lb

## 2017-12-17 DIAGNOSIS — E871 Hypo-osmolality and hyponatremia: Secondary | ICD-10-CM | POA: Insufficient documentation

## 2017-12-17 DIAGNOSIS — K5790 Diverticulosis of intestine, part unspecified, without perforation or abscess without bleeding: Secondary | ICD-10-CM

## 2017-12-17 LAB — COMPREHENSIVE METABOLIC PANEL
ALT: 29 U/L (ref 0–44)
ANION GAP: 12 (ref 5–15)
AST: 21 U/L (ref 15–41)
Albumin: 2.5 g/dL — ABNORMAL LOW (ref 3.5–5.0)
Alkaline Phosphatase: 175 U/L — ABNORMAL HIGH (ref 38–126)
BUN: 15 mg/dL (ref 6–20)
CO2: 24 mmol/L (ref 22–32)
Calcium: 8.8 mg/dL — ABNORMAL LOW (ref 8.9–10.3)
Chloride: 92 mmol/L — ABNORMAL LOW (ref 98–111)
Creatinine, Ser: 0.82 mg/dL (ref 0.44–1.00)
Glucose, Bld: 123 mg/dL — ABNORMAL HIGH (ref 70–99)
POTASSIUM: 4.8 mmol/L (ref 3.5–5.1)
SODIUM: 128 mmol/L — AB (ref 135–145)
TOTAL PROTEIN: 7.4 g/dL (ref 6.5–8.1)
Total Bilirubin: 2 mg/dL — ABNORMAL HIGH (ref 0.3–1.2)

## 2017-12-17 LAB — CBC WITH DIFFERENTIAL/PLATELET
Basophils Absolute: 0 10*3/uL (ref 0–0.1)
Basophils Relative: 0 %
EOS ABS: 0 10*3/uL (ref 0–0.7)
EOS PCT: 0 %
HCT: 30.7 % — ABNORMAL LOW (ref 35.0–47.0)
Hemoglobin: 10.7 g/dL — ABNORMAL LOW (ref 12.0–16.0)
LYMPHS ABS: 0.7 10*3/uL — AB (ref 1.0–3.6)
LYMPHS PCT: 6 %
MCH: 29.2 pg (ref 26.0–34.0)
MCHC: 34.9 g/dL (ref 32.0–36.0)
MCV: 83.5 fL (ref 80.0–100.0)
MONO ABS: 1.5 10*3/uL — AB (ref 0.2–0.9)
Monocytes Relative: 12 %
Neutro Abs: 9.9 10*3/uL — ABNORMAL HIGH (ref 1.4–6.5)
Neutrophils Relative %: 82 %
Platelets: 803 10*3/uL — ABNORMAL HIGH (ref 150–440)
RBC: 3.68 MIL/uL — AB (ref 3.80–5.20)
RDW: 14.3 % (ref 11.5–14.5)
WBC: 12.1 10*3/uL — AB (ref 3.6–11.0)

## 2017-12-17 NOTE — Patient Instructions (Signed)
Patient to try some slim fast and 1200 calories daily. Return in two weeks. Return to work on 01/13/2018.  The patient is aware to call back for any questions or concerns.

## 2017-12-17 NOTE — Progress Notes (Signed)
Patient ID: Casey KeelerWendelin J Soto, female   DOB: 03/31/1961, 57 y.o.   MRN: [id number]  Chief Complaint  Patient presents with  . Routine Post Op    HPI [name] is a 57 y.o. female here today for her post op left colectomy done on 12/02/2017. Patient state she has been having bowel come out of her rectum.  No fever but chills..   The patient reports she is been voiding without incident.  She is also recently started to have more rectal drainage which had discontinued while she was in the hospital.  Difficulties with smells has resolved.  Appetite is still off as liquids taste funny.  I spoke with her daughter at length yesterday about their concerns for her progress. HPI  Past Medical History:  Diagnosis Date  . Diverticulosis   . GERD (gastroesophageal reflux disease)   . Headache    H/O MIGRAINES  . Heart murmur    ASYMPTOMATIC  . Hypothyroidism   . Osteoporosis    borderline  . Thyroid disease    hypothyriod    Past Surgical History:  Procedure Laterality Date  . BREAST SURGERY     sterotatic biopsy on right breast.  . COLONOSCOPY  05/25/2010  . COLONOSCOPY WITH PROPOFOL N/A 12/25/2016   Flexible sigmoidoscopy, post procedure barium enema showing narrowing without mucosal abnormality in the sigmoid.  Marland Kitchen. LAPAROSCOPIC SIGMOID COLECTOMY N/A 12/02/2017   Procedure: LAPAROSCOPIC ASSISTED COLECTOMY; LOOP ILEOSTOMY;  Surgeon: Earline MayotteByrnett, Jeffrey W, MD;  Location: [facility] ORS;  Service: General;  Laterality: N/A;    Family History  Problem Relation Age of Onset  . Heart disease Father        congestive heart failure  . Hypothyroidism Sister   . Arthritis Sister        RA  . Diabetes Maternal Grandmother   . Hypothyroidism Maternal Grandmother   . Alzheimer's disease Paternal Grandmother   . Hypothyroidism Sister     Social History Social History   Tobacco Use  . Smoking status: Current Every Day Smoker    Packs/day: 0.25    Years: 15.00    Pack years: 3.75     Types: Cigarettes  . Smokeless tobacco: Never Used  Substance Use Topics  . Alcohol use: No  . Drug use: No    No Known Allergies  No current facility-administered medications for this visit.    No current outpatient medications on file.   Facility-Administered Medications Ordered in Other Visits  Medication Dose Route Frequency Provider Last Rate Last Dose  . 0.9 %  sodium chloride infusion   Intravenous Continuous Oley BalmHassell, Daniel, MD        Review of Systems Review of Systems  Constitutional: Positive for appetite change.  Respiratory: Negative.   Cardiovascular: Negative.     Blood pressure 108/62, pulse 66, resp. rate 12, height 5\' 5"  (1.651 m), weight 122 lb (55.3 kg).  Physical Exam Physical Exam  Constitutional: She is oriented to person, place, and time. She appears well-developed and well-nourished.  Cardiovascular: Normal rate, regular rhythm and normal heart sounds.  Pulmonary/Chest: Effort normal and breath sounds normal.  Abdominal:    Neurological: She is alert and oriented to person, place, and time.  Skin: Skin is warm and dry.    Data Reviewed Pathology from her December 02, 2017 sigmoid resection:  DIAGNOSIS:  A. RECTOSIGMOID COLON AND ADDITIONAL DISTAL MARGIN; SEGMENTAL RESECTION:  - DIVERTICULOSIS WITH CHRONIC PERICOLIC MICROABSCESS AND EXTENSIVE  ADHESIONS.   Assessment  Slow improvement post sigmoid resection with diverting ileostomy.  Assess for occult infection.    Plan  We will obtain a CBC and comprehensive metabolic panel this afternoon.  Follow-up may be changed based on these results.  Patient to try some slim fast and shoot for 1200 calories daily. Return in two weeks. Return to work on 01/13/2018.  The patient is aware to call back for any questions or concerns.    HPI, Physical Exam, Assessment and Plan have been scribed under the direction and in the presence of Donnalee Curry, MD.  Ples Specter, CMA  I have completed  the exam and reviewed the above documentation for accuracy and completeness.  I agree with the above.  Museum/gallery conservator has been used and any errors in dictation or transcription are unintentional.  Donnalee Curry, M.D., F.A.C.S.  Merrily Pew Byrnett 12/18/2017, 12:00 PM

## 2017-12-18 ENCOUNTER — Inpatient Hospital Stay: Admission: RE | Admit: 2017-12-18 | Payer: BC Managed Care – PPO | Source: Ambulatory Visit | Admitting: General Surgery

## 2017-12-18 ENCOUNTER — Other Ambulatory Visit: Payer: Self-pay

## 2017-12-18 ENCOUNTER — Inpatient Hospital Stay
Admission: RE | Admit: 2017-12-18 | Discharge: 2017-12-22 | DRG: 863 | Disposition: A | Discharge status: Home / Self Care | Payer: BC Managed Care – PPO | Source: Ambulatory Visit | Attending: General Surgery | Admitting: General Surgery

## 2017-12-18 ENCOUNTER — Other Ambulatory Visit: Payer: Self-pay | Admitting: General Surgery

## 2017-12-18 ENCOUNTER — Telehealth: Payer: Self-pay

## 2017-12-18 ENCOUNTER — Inpatient Hospital Stay: Payer: BC Managed Care – PPO

## 2017-12-18 DIAGNOSIS — F17210 Nicotine dependence, cigarettes, uncomplicated: Secondary | ICD-10-CM | POA: Diagnosis present

## 2017-12-18 DIAGNOSIS — D72828 Other elevated white blood cell count: Secondary | ICD-10-CM

## 2017-12-18 DIAGNOSIS — Z9049 Acquired absence of other specified parts of digestive tract: Secondary | ICD-10-CM

## 2017-12-18 DIAGNOSIS — M810 Age-related osteoporosis without current pathological fracture: Secondary | ICD-10-CM | POA: Diagnosis present

## 2017-12-18 DIAGNOSIS — Z79890 Hormone replacement therapy (postmenopausal): Secondary | ICD-10-CM

## 2017-12-18 DIAGNOSIS — Z932 Ileostomy status: Secondary | ICD-10-CM | POA: Diagnosis not present

## 2017-12-18 DIAGNOSIS — N739 Female pelvic inflammatory disease, unspecified: Secondary | ICD-10-CM

## 2017-12-18 DIAGNOSIS — Y838 Other surgical procedures as the cause of abnormal reaction of the patient, or of later complication, without mention of misadventure at the time of the procedure: Secondary | ICD-10-CM | POA: Diagnosis present

## 2017-12-18 DIAGNOSIS — E440 Moderate protein-calorie malnutrition: Secondary | ICD-10-CM | POA: Diagnosis present

## 2017-12-18 DIAGNOSIS — N738 Other specified female pelvic inflammatory diseases: Secondary | ICD-10-CM | POA: Diagnosis present

## 2017-12-18 DIAGNOSIS — B954 Other streptococcus as the cause of diseases classified elsewhere: Secondary | ICD-10-CM | POA: Diagnosis present

## 2017-12-18 DIAGNOSIS — R109 Unspecified abdominal pain: Secondary | ICD-10-CM

## 2017-12-18 DIAGNOSIS — Z8249 Family history of ischemic heart disease and other diseases of the circulatory system: Secondary | ICD-10-CM | POA: Diagnosis not present

## 2017-12-18 DIAGNOSIS — Z79899 Other long term (current) drug therapy: Secondary | ICD-10-CM

## 2017-12-18 DIAGNOSIS — E039 Hypothyroidism, unspecified: Secondary | ICD-10-CM | POA: Diagnosis present

## 2017-12-18 DIAGNOSIS — Z8349 Family history of other endocrine, nutritional and metabolic diseases: Secondary | ICD-10-CM | POA: Diagnosis not present

## 2017-12-18 DIAGNOSIS — Z6820 Body mass index (BMI) 20.0-20.9, adult: Secondary | ICD-10-CM | POA: Diagnosis not present

## 2017-12-18 DIAGNOSIS — K219 Gastro-esophageal reflux disease without esophagitis: Secondary | ICD-10-CM | POA: Diagnosis present

## 2017-12-18 DIAGNOSIS — E871 Hypo-osmolality and hyponatremia: Secondary | ICD-10-CM

## 2017-12-18 DIAGNOSIS — T8143XA Infection following a procedure, organ and space surgical site, initial encounter: Secondary | ICD-10-CM | POA: Diagnosis present

## 2017-12-18 DIAGNOSIS — L0291 Cutaneous abscess, unspecified: Secondary | ICD-10-CM

## 2017-12-18 LAB — BILIRUBIN, DIRECT: Bilirubin, Direct: 0.8 mg/dL — ABNORMAL HIGH (ref 0.0–0.2)

## 2017-12-18 LAB — PROTIME-INR
INR: 1.12
PROTHROMBIN TIME: 14.3 s (ref 11.4–15.2)

## 2017-12-18 MED ORDER — IBUPROFEN 400 MG PO TABS
200.0000 mg | ORAL_TABLET | Freq: Three times a day (TID) | ORAL | Status: DC | PRN
  Administered 2017-12-20 – 2017-12-21 (×2): 200 mg via ORAL
  Filled 2017-12-18 (×2): qty 1

## 2017-12-18 MED ORDER — ACETAMINOPHEN 325 MG PO TABS: 650.0000 mg | ORAL_TABLET | ORAL | Status: DC | PRN

## 2017-12-18 MED ORDER — MIDAZOLAM HCL 2 MG/2ML IJ SOLN
INTRAMUSCULAR | Status: AC | PRN
  Administered 2017-12-18 (×2): 1 mg via INTRAVENOUS

## 2017-12-18 MED ORDER — IOHEXOL 300 MG/ML  SOLN
100.0000 mL | Freq: Once | INTRAMUSCULAR | Status: AC | PRN
  Administered 2017-12-18: 100 mL via INTRAVENOUS

## 2017-12-18 MED ORDER — PIPERACILLIN-TAZOBACTAM 3.375 G IVPB
3.3750 g | Freq: Three times a day (TID) | INTRAVENOUS | Status: DC
  Administered 2017-12-18 – 2017-12-22 (×12): 3.375 g via INTRAVENOUS
  Filled 2017-12-18 (×12): qty 50

## 2017-12-18 MED ORDER — SODIUM CHLORIDE 0.9 % IV SOLN
INTRAVENOUS | Status: DC | PRN
  Administered 2017-12-18: 13:00:00 via INTRAVENOUS

## 2017-12-18 MED ORDER — PANTOPRAZOLE SODIUM 40 MG PO TBEC
40.0000 mg | DELAYED_RELEASE_TABLET | Freq: Every day | ORAL | Status: DC | PRN
  Filled 2017-12-18: qty 1

## 2017-12-18 MED ORDER — ONDANSETRON HCL 4 MG PO TABS: 4.0000 mg | ORAL_TABLET | ORAL | Status: DC | PRN

## 2017-12-18 MED ORDER — FENTANYL CITRATE (PF) 100 MCG/2ML IJ SOLN
INTRAMUSCULAR | Status: AC
  Filled 2017-12-18: qty 4

## 2017-12-18 MED ORDER — THYROID 30 MG PO TABS
90.0000 mg | ORAL_TABLET | Freq: Every day | ORAL | Status: DC
  Administered 2017-12-19 – 2017-12-22 (×4): 90 mg via ORAL
  Filled 2017-12-18 (×4): qty 3

## 2017-12-18 MED ORDER — SODIUM CHLORIDE 0.9% FLUSH
5.0000 mL | Freq: Three times a day (TID) | INTRAVENOUS | Status: DC
  Administered 2017-12-18 – 2017-12-22 (×11): 5 mL

## 2017-12-18 MED ORDER — ENOXAPARIN SODIUM 30 MG/0.3ML ~~LOC~~ SOLN: 30.0000 mg | SUBCUTANEOUS | Status: DC

## 2017-12-18 MED ORDER — SODIUM CHLORIDE 0.9 % IV SOLN
INTRAVENOUS | Status: DC
  Administered 2017-12-18 – 2017-12-21 (×4): via INTRAVENOUS

## 2017-12-18 MED ORDER — MIDAZOLAM HCL 5 MG/5ML IJ SOLN
INTRAMUSCULAR | Status: AC
  Filled 2017-12-18: qty 5

## 2017-12-18 MED ORDER — SODIUM CHLORIDE 0.9 % IV SOLN: INTRAVENOUS | Status: DC

## 2017-12-18 MED ORDER — RISAQUAD PO CAPS
1.0000 | ORAL_CAPSULE | Freq: Every day | ORAL | Status: DC
  Administered 2017-12-18 – 2017-12-22 (×5): 1 via ORAL
  Filled 2017-12-18 (×5): qty 1

## 2017-12-18 MED ORDER — LIDOCAINE HCL (PF) 1 % IJ SOLN
INTRAMUSCULAR | Status: AC | PRN
  Administered 2017-12-18: 10 mL

## 2017-12-18 MED ORDER — THYROID 60 MG PO TABS
90.0000 mg | ORAL_TABLET | Freq: Every day | ORAL | Status: DC
  Filled 2017-12-18: qty 1

## 2017-12-18 MED ORDER — OXYCODONE HCL 5 MG PO TABS
5.0000 mg | ORAL_TABLET | ORAL | Status: DC | PRN
  Administered 2017-12-18: 5 mg via ORAL
  Filled 2017-12-18: qty 1

## 2017-12-18 MED ORDER — FENTANYL CITRATE (PF) 100 MCG/2ML IJ SOLN
INTRAMUSCULAR | Status: AC | PRN
  Administered 2017-12-18 (×2): 50 ug via INTRAVENOUS

## 2017-12-18 MED ORDER — ENOXAPARIN SODIUM 30 MG/0.3ML ~~LOC~~ SOLN
30.0000 mg | SUBCUTANEOUS | Status: DC
  Administered 2017-12-19 – 2017-12-22 (×4): 30 mg via SUBCUTANEOUS
  Filled 2017-12-18 (×4): qty 0.3

## 2017-12-18 NOTE — Progress Notes (Addendum)
Post IR Procedure Anticoagulation Monitoring Consult Standard bleeding risk per consult Per protocol, will retime ppx enoxaparin to start Day+1 (Next AM)

## 2017-12-18 NOTE — Care Management (Signed)
Patient admitted for Pelvic abscess.  Patient lives at home with husband.  PCP [place].  Was discharged from hospital last month with home health services through [facility].  RNCM confirmed with Barbara CowerJason [facility] that patient is still open. Plans are for CT-guided drainage

## 2017-12-18 NOTE — Telephone Encounter (Signed)
-----   Message from Earline MayotteJeffrey W Byrnett, MD sent at 12/18/2017  8:22 AM EDT ----- Please arrange for a CT of the abdomen and pelvis as soon as possible. Leukocytosis post colon resection. PO and IV contrast. Instruct patient to limit water to no more than 2 glasses daily. Other nutritional drinks as desired.   ----- Message ----- From: Leory PlowmanInterface, [facility] [place] Sent: 12/17/2017   5:03 PM To: Earline MayotteJeffrey W Byrnett, MD

## 2017-12-18 NOTE — Progress Notes (Signed)
Patient post pelvic abscess drain tube placement per Dr Deanne CofferHassell, tolerated well, vitals remained stable throughout procedure. Denies complaints at this time. Taking back to room, patient awake, alert and oriented at this time. Report given to [facility] RN on 2C (220), with questions answered.drainage bag intact with purulent thick beige drainage noted.

## 2017-12-18 NOTE — Progress Notes (Signed)
Pharmacy Antibiotic Note  [name] is a 57 y.o. female admitted on 12/18/2017 with Pelvic abscess status post sigmoid resection for severe diverticulitis.Marland Kitchen.  Pharmacy has been consulted for Zosyn dosing.  Plan: Zosyn 3.375g IV q8h (4 hour infusion).  Height: 5\' 5"  (165.1 cm) Weight: 121 lb 7.6 oz (55.1 kg) IBW/kg (Calculated) : 57  Temp (24hrs), Avg:98.3 F (36.8 C), Min:98.3 F (36.8 C), Max:98.3 F (36.8 C)  Recent Labs  Lab 12/17/17 1655  WBC 12.1*  CREATININE 0.82    Estimated Creatinine Clearance: 65 mL/min (by C-G formula based on SCr of 0.82 mg/dL).    No Known Allergies  Antimicrobials this admission: 7/3 Zosyn >>  Dose adjustments this admission:   Microbiology results: None so far  Thank you for allowing pharmacy to be a part of this patient's care.  Marty HeckWang, Shann Lewellyn L 12/18/2017 12:45 PM

## 2017-12-18 NOTE — Procedures (Signed)
  Procedure: CT placement 4763f R transgluteal pelvis abscess drain catheter   EBL:   minimal Complications:  none immediate  See full dictation in [facility] P[facility].  Thora Lance. Akai Dollard MD Main # [phone] Pager  [phone]

## 2017-12-18 NOTE — Telephone Encounter (Signed)
Patient is scheduled for a CT abdomen Pelvis with contrast at [facility]. She will go now to the hospital and have her ct done.

## 2017-12-18 NOTE — H&P (Signed)
[name] is an 57 y.o. female.   Chief Complaint: Chills, lower abdominal pain   HPI: The patient was seen in the office yesterday in follow-up after hair sigmoid resection and diverting loop ileostomy completed on December 02, 2017.  She is experienced occasional chills but no fevers.  Diet was still poor.  She had lost some weight.  Laboratory studies obtained late yesterday showed a recurrent leukocytosis and markedly elevated platelet count suggestive of undiagnosed infection.  Also notable were elevation of her total bilirubin alkaline phosphatase.  She underwent CT of the abdomen and pelvis today showing a bilobed abscess in the pelvis.  She is admitted for CT-guided drainage and IV antibiotics.    Past Medical History:  Diagnosis Date  . Diverticulosis   . GERD (gastroesophageal reflux disease)   . Headache    H/O MIGRAINES  . Heart murmur    ASYMPTOMATIC  . Hypothyroidism   . Osteoporosis    borderline  . Thyroid disease    hypothyriod    Past Surgical History:  Procedure Laterality Date  . BREAST SURGERY     sterotatic biopsy on right breast.  . COLONOSCOPY  05/25/2010  . COLONOSCOPY WITH PROPOFOL N/A 12/25/2016   Flexible sigmoidoscopy, post procedure barium enema showing narrowing without mucosal abnormality in the sigmoid.  Marland Kitchen LAPAROSCOPIC SIGMOID COLECTOMY N/A 12/02/2017   Procedure: LAPAROSCOPIC ASSISTED COLECTOMY; LOOP ILEOSTOMY;  Surgeon: Robert Bellow, MD;  Location: [facility] ORS;  Service: General;  Laterality: N/A;    Family History  Problem Relation Age of Onset  . Heart disease Father        congestive heart failure  . Hypothyroidism Sister   . Arthritis Sister        RA  . Diabetes Maternal Grandmother   . Hypothyroidism Maternal Grandmother   . Alzheimer's disease Paternal Grandmother   . Hypothyroidism Sister    Social History:  reports that she has been smoking cigarettes.  She has a 3.75 pack-year smoking history. She has never used  smokeless tobacco. She reports that she does not drink alcohol or use drugs.  Allergies: No Known Allergies  Medications Prior to Admission  Medication Sig Dispense Refill  . ARMOUR THYROID 90 MG tablet Take 90 mg by mouth daily before breakfast.  3  . Ascorbic Acid (VITAMIN C) 1000 MG tablet Take 1,000 mg by mouth at bedtime.    . Calcium-Vitamin D-Vitamin K (CALCIUM + D + K PO) Take 1 tablet by mouth 2 (two) times daily. With lunch & at bedtime.    . Cholecalciferol (VITAMIN D3) 2000 units TABS Take 2,000 Units by mouth 2 (two) times daily. With lunch & at bedtime.    . clobetasol ointment (TEMOVATE) 1.61 % Apply 1 application topically as needed.     Marland Kitchen ibuprofen (ADVIL,MOTRIN) 200 MG tablet Take 200 mg by mouth 3 (three) times daily as needed (for pain/headaches.).    Marland Kitchen omeprazole (PRILOSEC OTC) 20 MG tablet Take 20 mg by mouth daily as needed (for acid reflux.).     Marland Kitchen OVER THE COUNTER MEDICATION Patient states she is taking various other supplements/vitamins which have been held due to upcoming procedure--but does not wish to list these items.    . Probiotic Product (PROBIOTIC PO) Take 1 capsule by mouth daily.      Results for orders placed or performed during the hospital encounter of 12/17/17 (from the past 48 hour(s))  CBC with Differential/Platelet     Status: Abnormal   Collection  Time: 12/17/17  4:55 PM  Result Value Ref Range   WBC 12.1 (H) 3.6 - 11.0 K/uL   RBC 3.68 (L) 3.80 - 5.20 MIL/uL   Hemoglobin 10.7 (L) 12.0 - 16.0 g/dL   HCT 30.7 (L) 35.0 - 47.0 %   MCV 83.5 80.0 - 100.0 fL   MCH 29.2 26.0 - 34.0 pg   MCHC 34.9 32.0 - 36.0 g/dL   RDW 14.3 11.5 - 14.5 %   Platelets 803 (H) 150 - 440 K/uL   Neutrophils Relative % 82 %   Neutro Abs 9.9 (H) 1.4 - 6.5 K/uL   Lymphocytes Relative 6 %   Lymphs Abs 0.7 (L) 1.0 - 3.6 K/uL   Monocytes Relative 12 %   Monocytes Absolute 1.5 (H) 0.2 - 0.9 K/uL   Eosinophils Relative 0 %   Eosinophils Absolute 0.0 0 - 0.7 K/uL    Basophils Relative 0 %   Basophils Absolute 0.0 0 - 0.1 K/uL    Comment: Performed at [facility], [address] [place], [place] [address]  Comprehensive metabolic panel     Status: Abnormal   Collection Time: 12/17/17  4:55 PM  Result Value Ref Range   Sodium 128 (L) 135 - 145 mmol/L   Potassium 4.8 3.5 - 5.1 mmol/L   Chloride 92 (L) 98 - 111 mmol/L    Comment: Please note change in reference range.   CO2 24 22 - 32 mmol/L   Glucose, Bld 123 (H) 70 - 99 mg/dL    Comment: Please note change in reference range.   BUN 15 6 - 20 mg/dL    Comment: Please note change in reference range.   Creatinine, Ser 0.82 0.44 - 1.00 mg/dL   Calcium 8.8 (L) 8.9 - 10.3 mg/dL   Total Protein 7.4 6.5 - 8.1 g/dL   Albumin 2.5 (L) 3.5 - 5.0 g/dL   AST 21 15 - 41 U/L   ALT 29 0 - 44 U/L    Comment: Please note change in reference range.   Alkaline Phosphatase 175 (H) 38 - 126 U/L   Total Bilirubin 2.0 (H) 0.3 - 1.2 mg/dL   GFR calc non Af Amer >60 >60 mL/min   GFR calc Af Amer >60 >60 mL/min    Comment: (NOTE) The eGFR has been calculated using the CKD EPI equation. This calculation has not been validated in all clinical situations. eGFR's persistently <60 mL/min signify possible Chronic Kidney Disease.    Anion gap 12 5 - 15    Comment: Performed at [facility], [address] [place], [place] [address]  Bilirubin, Direct     Status: Abnormal   Collection Time: 12/17/17  4:55 PM  Result Value Ref Range   Bilirubin, Direct 0.8 (H) 0.0 - 0.2 mg/dL    Comment: Please note change in reference range. Performed at [facility], [address] [place], [place] [address]    Ct Abdomen Pelvis W Contrast  Result Date: 12/18/2017 CLINICAL DATA:  57 year old female with a history of partial colectomy and ostomy formation, now with leukocytosis. Surgical report describes a loop colostomy, with continuity maintained after rectosigmoid resection and primary anastomosis  performed. EXAM: CT ABDOMEN AND PELVIS WITH CONTRAST TECHNIQUE: Multidetector CT imaging of the abdomen and pelvis was performed using the standard protocol following bolus administration of intravenous contrast. CONTRAST:  152m OMNIPAQUE IOHEXOL 300 MG/ML  SOLN COMPARISON:  09/05/2017 FINDINGS: Lower chest: No acute abnormality. Hepatobiliary: Unremarkable appearance of liver parenchyma.  Unremarkable gallbladder. Pancreas: Unremarkable pancreas Spleen: Unremarkable spleen Adrenals/Urinary Tract: Unremarkable adrenal glands. No evidence of hydronephrosis or nephrolithiasis. Unremarkable course the bilateral ureters. Urinary bladder is relatively decompressed. Stomach/Bowel: Unremarkable stomach. Enteric contrast present within the stomach, small bowel. Enteric contrast does not reach the colon. Loop small bowel ostomy within the right lower quadrant. Enteric contrast reaches the ostomy and the ostomy apparatus. No evidence of significantly dilated small bowel. No transition point within small bowel. Small volume of fluid within the cecum and ascending colon, which is relatively decompressed. Surgical staple line/suture line in the left pelvis on the superior aspect of pelvic fluid collection as well as at the inferior aspect of the pelvic fluid collection. A do not identify continuity between the proximal and distal segments of colon/rectum. Rim enhancing fluid collection with internal gas measures 6 cm x 12 cm on image 59, within the superior/posterior pelvis. Rim enhancing gas and fluid collection within the more inferior/anterior pelvis (anterior and superior to the urinary bladder) measures 5.0 cm x 8.2 cm. Gas tracks superiorly within the interloop region of the mesentery, with gas and fluid tracking along the left pericolic gutter. The proximal descending colon demonstrates circumferential wall thickening extending to the proximal suture line. Vascular/Lymphatic: Mild atherosclerotic changes of the abdominal  aorta and iliac arteries. Proximal femoral arteries are patent. Reproductive: Unremarkable Other: Surgical changes along the midline abdomen. Musculoskeletal: No acute displaced fracture. Mild degenerative changes of the spine. No significant degenerative changes of the hips. IMPRESSION: Complex abscess within the pelvis, adjacent to the prior surgical site of rectosigmoid resection and primary anastomosis. The largest component within the superior/posterior pelvis measures approximately 12 cm, and the largest component in the low anterior pelvis measures approximately 8.2 cm. Surgical changes of rectosigmoid anastomosis. There appears to be disruption of the surgical suture line, with free-floating surgical sutures within the abscess, and I cannot confidently confirm continuity of the bowel at the surgical site, potentially representing dehiscence/disruption. Additional surgical changes of midline laparotomy and loop ostomy of distal small bowel. Mild atherosclerosis of the abdominal aorta. Aortic Atherosclerosis (ICD10-I70.0). Electronically Signed   By: Corrie Mckusick D.O.   On: 12/18/2017 11:58    ROS  Blood pressure (!) 95/57, pulse (!) 114, temperature 98.3 F (36.8 C), temperature source Oral, resp. rate (!) 24, SpO2 98 %. Physical Exam  Constitutional: She is oriented to person, place, and time. She appears well-developed and well-nourished.  HENT:  Head: Normocephalic and atraumatic.  Eyes: Pupils are equal, round, and reactive to light.  Neck: Normal range of motion. Neck supple.  Cardiovascular: Regular rhythm.  Respiratory: Effort normal and breath sounds normal.  GI: Soft. Bowel sounds are normal.  Musculoskeletal: Normal range of motion.  Neurological: She is alert and oriented to person, place, and time. She has normal reflexes.  Skin: Skin is warm and dry.  Psychiatric: She has a normal mood and affect. Her behavior is normal. Judgment and thought content normal.      Assessment/Plan Pelvic abscess status post sigmoid resection for severe diverticulitis.  The patient CT images have been reviewed with Dr. Vernard Gambles from radiology.  Plans are for CT-guided drainage of the most dependent abscess cavity with hopes that both are in continuity to provide complete decompression of the pelvic inflammatory process.  The patient will be able to maintain her diet with her diverting ileostomy.  Anticipate 4-6 days depending on resolution of her inflammatory symptoms.  Robert Bellow, MD 12/18/2017, 12:24 PM

## 2017-12-19 LAB — BASIC METABOLIC PANEL
ANION GAP: 8 (ref 5–15)
BUN: 11 mg/dL (ref 6–20)
CALCIUM: 7.4 mg/dL — AB (ref 8.9–10.3)
CO2: 25 mmol/L (ref 22–32)
Chloride: 100 mmol/L (ref 98–111)
Creatinine, Ser: 0.69 mg/dL (ref 0.44–1.00)
GFR calc Af Amer: >60 mL/min (ref >60)
GLUCOSE: 99 mg/dL (ref 70–99)
Potassium: 3.6 mmol/L (ref 3.5–5.1)
SODIUM: 133 mmol/L — AB (ref 135–145)

## 2017-12-19 LAB — CBC
HEMATOCRIT: 24.9 % — AB (ref 35.0–47.0)
Hemoglobin: 8.7 g/dL — ABNORMAL LOW (ref 12.0–16.0)
MCH: 29 pg (ref 26.0–34.0)
MCHC: 34.8 g/dL (ref 32.0–36.0)
MCV: 83.5 fL (ref 80.0–100.0)
Platelets: 536 10*3/uL — ABNORMAL HIGH (ref 150–440)
RBC: 2.98 MIL/uL — ABNORMAL LOW (ref 3.80–5.20)
RDW: 13.8 % (ref 11.5–14.5)
WBC: 6.1 10*3/uL (ref 3.6–11.0)

## 2017-12-19 LAB — HEPATIC FUNCTION PANEL
ALT: 18 U/L (ref 0–44)
AST: 12 U/L — AB (ref 15–41)
Albumin: 2 g/dL — ABNORMAL LOW (ref 3.5–5.0)
Alkaline Phosphatase: 117 U/L (ref 38–126)
BILIRUBIN DIRECT: 0.4 mg/dL — AB (ref 0.0–0.2)
Indirect Bilirubin: 0.6 mg/dL (ref 0.3–0.9)
TOTAL PROTEIN: 5.7 g/dL — AB (ref 6.5–8.1)
Total Bilirubin: 1 mg/dL (ref 0.3–1.2)

## 2017-12-19 MED ORDER — ASPIRIN 325 MG PO TABS
325.0000 mg | ORAL_TABLET | Freq: Once | ORAL | Status: AC
  Administered 2017-12-19: 325 mg via ORAL
  Filled 2017-12-19: qty 1

## 2017-12-19 MED ORDER — NEPRO/CARBSTEADY PO LIQD
237.0000 mL | Freq: Two times a day (BID) | ORAL | Status: DC
  Administered 2017-12-20: 237 mL via ORAL

## 2017-12-19 MED ORDER — ASPIRIN 81 MG PO CHEW
81.0000 mg | CHEWABLE_TABLET | Freq: Every day | ORAL | Status: DC
  Administered 2017-12-20 – 2017-12-22 (×3): 81 mg via ORAL
  Filled 2017-12-19 (×3): qty 1

## 2017-12-19 MED ORDER — PRO-STAT SUGAR FREE PO LIQD
30.0000 mL | Freq: Three times a day (TID) | ORAL | Status: DC
  Administered 2017-12-19 – 2017-12-22 (×9): 30 mL via ORAL

## 2017-12-19 NOTE — Progress Notes (Addendum)
Afebrile. Modestly low BP without symptoms. Feeling much better overall s/p perc drain by interventional radiology. Odor from drainage biggest turnoff at this time. Lungs: Clear. ABD: Soft.  Drain site clean. U/O good. Labs:  WBC back to normal. Platelet count down. HGB 8.7 post IV fluids. Sodium trending up, now 133. Renal function normal. Bili improved, alk phos normalized.  IMP: Marked improvement. Plan: Encourage PO, work on protein intake, and ambulation.  Anticipate she will be her for a few more days.

## 2017-12-19 NOTE — Progress Notes (Signed)
Pharmacy consult noted for "Please do not change Lovenox dose without contacting the attending physician. No additional laboratory studies today." SCr 0.69 mg/dL, Hgb 8.7, Plt [id number]. MD notes Hgb drop likely dilutional. If surgically appropriate patient could likely receive enoxaparin 40 mg subcutaneously daily - body weight greater than 45 kg and CrCl greater than 30 mL/min.  [name], Vermont[place]arm.D., BCPS Clinical Pharmacist 12/19/2017 13:09

## 2017-12-19 NOTE — Progress Notes (Addendum)
Initial Nutrition Assessment  DOCUMENTATION CODES:   Non-severe (moderate) malnutrition in context of acute illness/injury  INTERVENTION:   Nepro Shake po BID, each supplement provides 425 kcal and 19 grams protein  Prostat liquid protein PO 30 ml TID, each supplement provides 100 kcal, 15 grams protein.  Snacks   Pt likely at risk for refeeding; would recommend monitor K, Mg, and P labs  NUTRITION DIAGNOSIS:   Moderate Malnutrition related to acute illness as evidenced by mild fat depletion, mild muscle depletion, 14 percent weight loss in < 4 months.  GOAL:   Patient will meet greater than or equal to 90% of their needs  MONITOR:   PO intake, Supplement acceptance, Labs, Weight trends, Skin, I & O's  REASON FOR ASSESSMENT:   Consult Assessment of nutrition requirement/status  ASSESSMENT:   57 y/o female admitted with pelvic abscess s/p sigmoid resection and diverting loop ileostomy completed on December 02, 2017. Pt s/p CT placement 86fR transgluteal pelvis abscess drain catheter 7/3   Met with pt in room today. RD familiar with this pt from recent previous admit. Pt reports poor appetite and oral intake since discharge from the hospital on 6/24. Pt's appetite was slowly starting to improve at time of discharge but pt was only eating small amounts and refusing supplements. Per chart, pt has continued to have a steady decline in her weight over the past 4 months; pt has now lost 20lbs(14%) in < 4 months which is significant given the time frame. Pt has developed mild muscle and fat depletions. RD had a long discussion with pt and family members today about the importance of adequate calories and protein needed for post op healing as well as to preserve lean muscle. Pt previously reluctant to drink supplements as she reports "I am very picky" but after discussion, she is willing to try some different options. Pt does not like anything overly sweet and prefers strawberry or mixed  berry flavors. RD provided pt with a mixed berry Nepro at time of visit today; pt reports that this is ok and she is willing to sip on it throughout the day. Pt does complain about the high amount of volume of the supplement drinks; offered Prostat as an option which pt is willing to try. RD will order snacks for in between meals. Notified RN that pt may have whatever supplement she wants and to notify RD if pt refusing supplements. Pt did eat some toast, bacon, and yogurt for breakfast today.     Medications reviewed and include: risaquad, aspirin, lovenox, thyroid, NaCl _0 /hr, zosyn  Labs reviewed: Na 133(L), K 3.6 wnl, Ca 7.4(L) adj. 9.0 wnl, alb 2.0(L) Hgb 8.7(L), Hct 24.9(L)  NUTRITION - FOCUSED PHYSICAL EXAM:    Most Recent Value  Orbital Region  Mild depletion  Upper Arm Region  Moderate depletion  Thoracic and Lumbar Region  Mild depletion  Buccal Region  Moderate depletion  Temple Region  Mild depletion  Clavicle Bone Region  Mild depletion  Clavicle and Acromion Bone Region  Mild depletion  Scapular Bone Region  Mild depletion  Dorsal Hand  Mild depletion  Patellar Region  Moderate depletion  Anterior Thigh Region  Mild depletion  Posterior Calf Region  Mild depletion  Edema (RD Assessment)  None  Hair  Reviewed  Eyes  Reviewed  Mouth  Reviewed  Skin  Reviewed  Nails  Reviewed     Diet Order:   Diet Order  Diet regular Room service appropriate? Yes; Fluid consistency: Thin  Diet effective now         EDUCATION NEEDS:   Education needs have been addressed  Skin:  Skin Assessment: Reviewed RN Assessment(closed incision abdomen )  Last BM:  7/4 type 7 via ostomy   Height:   Ht Readings from Last 1 Encounters:  12/18/17 _0  (1.651 m)    Weight:   Wt Readings from Last 1 Encounters:  12/18/17 121 lb 7.6 oz (55.1 kg)    Ideal Body Weight:  56.8 kg  BMI:  Body mass index is 20.21 kg/m.  Estimated Nutritional Needs:   Kcal:   1500-1800kcal/day  Protein:  72-83g/day   Fluid:  >1.5L/day   Koleen Distance MS, RD, LDN Pager #- [phone] Office#- [phone] After Hours Pager: [phone]

## 2017-12-19 NOTE — Progress Notes (Deleted)
Patient is requesting to walk.  Order received from Dr Thelma Bargeaks to place chest tube to water seal when patient ambulates

## 2017-12-20 DIAGNOSIS — E440 Moderate protein-calorie malnutrition: Secondary | ICD-10-CM

## 2017-12-20 MED ORDER — NEPRO/CARBSTEADY PO LIQD
237.0000 mL | ORAL | Status: DC
  Administered 2017-12-20: 237 mL via ORAL

## 2017-12-20 NOTE — Progress Notes (Addendum)
SURGICAL PROGRESS NOTE  Hospital Day(s): 4.   Post op day(s):  Marland Kitchen.   Interval History: Patient seen and examined, no acute events or new complaints overnight. Patient reports she ambulated once "after nobody else in the halls" and says she's been trying to eat more and agrees to nutritional supplements, but complains her snacks and supplements have been brought at the same time as her meals and she cannot finish both. She otherwise denies fever/chills, CP, or SOB.  Review of Systems:  Constitutional: denies fever, chills  Respiratory: denies any shortness of breath  Cardiovascular: denies chest pain or palpitations  Gastrointestinal: abdominal pain, N/V, and bowel function as per interval history Musculoskeletal: denies pain, decreased motor or sensation Integumentary: denies any other rashes or skin discolorations except post-surgical abdominal wounds and drain  Vital signs in last 24 hours: [min-max] current  Temp:  [98.2 F (36.8 C)-98.7 F (37.1 C)] 98.7 F (37.1 C) (07/05 0527) Pulse Rate:  [73-80] 73 (07/05 0527) Resp:  [16-18] 16 (07/05 0527) BP: (91-118)/(51-66) 114/56 (07/05 0527) SpO2:  [98 %-99 %] 98 % (07/05 0527)     Height: 5\' 5"  (165.1 cm) Weight: 121 lb 7.6 oz (55.1 kg) BMI (Calculated): 20.21   Intake/Output this shift:  No intake/output data recorded.   Intake/Output last 2 shifts:  @IOLAST2SHIFTS @   Physical Exam:  Constitutional: alert, cooperative and no distress  Respiratory: breathing non-labored at rest  Cardiovascular: regular rate and sinus rhythm  Gastrointestinal: soft and non-distended with viable and functional loop ileostomy, mild peri-incisional tenderness to palpation with incision well-approximated without surrounding erythema or drainage, drain well-secured  Labs:  CBC Latest Ref Rng & Units 12/19/2017 12/17/2017 12/06/2017  WBC 3.6 - 11.0 K/uL 6.1 12.1(H) 10.9  Hemoglobin 12.0 - 16.0 g/dL 2.8(U8.7(L) 10.7(L) 10.9(L)  Hematocrit 35.0 - 47.0 %  24.9(L) 30.7(L) 32.0(L)  Platelets 150 - 440 K/uL 536(H) 803(H) 366   CMP Latest Ref Rng & Units 12/19/2017 12/17/2017 12/09/2017  Glucose 70 - 99 mg/dL 99 132(G123(H) -  BUN 6 - 20 mg/dL 11 15 -  Creatinine 4.010.44 - 1.00 mg/dL 0.270.69 2.530.82 6.640.44  Sodium 135 - 145 mmol/L 133(L) 128(L) -  Potassium 3.5 - 5.1 mmol/L 3.6 4.8 -  Chloride 98 - 111 mmol/L 100 92(L) -  CO2 22 - 32 mmol/L 25 24 -  Calcium 8.9 - 10.3 mg/dL 7.4(L) 8.8(L) -  Total Protein 6.5 - 8.1 g/dL 4.0(H5.7(L) 7.4 -  Total Bilirubin 0.3 - 1.2 mg/dL 1.0 2.0(H) -  Alkaline Phos 38 - 126 U/L 117 175(H) -  AST 15 - 41 U/L 12(L) 21 -  ALT 0 - 44 U/L 18 29 -   Imaging studies: No new pertinent imaging studies   Assessment/Plan: 57 y.o. female doing overall better/well 2 weeks s/p sigmoid colectomy with diverting loop ileostomy for diverticulitis with pelvic abscess and diverticular stricture, complicated by malnutrition, post-operative pelvic abscess s/p image-guided drainage (7/3), and by pertinent comorbidities including hypothyroidism, asymptomatic cardiac murmur, GERD, migraine headaches, and osteoporosis.   - regular diet with nutritional supplements encouraged  - continue to monitor abdominal exam and bowel function  - additional ambulation strongly encouraged, DVT prophylaxis as ordered  - follow up labs ordered for tomorrow morning  - medical management of comorbidities   All of the above findings and recommendations were discussed with the patient, patient's family, and patient's RN, and all of patient's and family's questions were answered to their expressed satisfaction.  -- Scherrie GerlachJason E. Earlene Plateravis, MD, RPVI [place]:  [facility] - Partnering for exceptional care. Office: [phone]

## 2017-12-20 NOTE — Progress Notes (Signed)
Pharmacy note appreciated. Will hold Lovenox at present dose base on possibility of infected pelvic hematoma with late fall in HGB. Patient is ambulatory and at lower risk for DVT based on being 2 weeks out from surgery.  Dietician note appreciated.

## 2017-12-20 NOTE — Progress Notes (Signed)
Pharmacy Antibiotic Note  [name] is a 57 y.o. female admitted on 12/18/2017 with Pelvic abscess status post sigmoid resection for severe diverticulitis.Marland Kitchen.  Pharmacy has been consulted for Zosyn dosing.  Plan: Zosyn 3.375g IV q8h (4 hour infusion).  Height: 5\' 5"  (165.1 cm) Weight: 121 lb 7.6 oz (55.1 kg) IBW/kg (Calculated) : 57  Temp (24hrs), Avg:98.5 F (36.9 C), Min:98.2 F (36.8 C), Max:98.7 F (37.1 C)  Recent Labs  Lab 12/17/17 1655 12/19/17 0524  WBC 12.1* 6.1  CREATININE 0.82 0.69    Estimated Creatinine Clearance: 66.7 mL/min (by C-G formula based on SCr of 0.69 mg/dL).    No Known Allergies  Antimicrobials this admission: 7/3 Zosyn >>  Dose adjustments this admission:  Microbiology results: WCx pending (too young to read)  Thank you for allowing pharmacy to be a part of this patient's care.  Marty HeckWang, Graylin Sperling L 12/20/2017 8:26 AM

## 2017-12-21 LAB — COMPREHENSIVE METABOLIC PANEL
ALBUMIN: 1.8 g/dL — AB (ref 3.5–5.0)
ALK PHOS: 89 U/L (ref 38–126)
ALT: 14 U/L (ref 0–44)
ANION GAP: 7 (ref 5–15)
AST: 12 U/L — AB (ref 15–41)
BUN: 9 mg/dL (ref 6–20)
CALCIUM: 7.3 mg/dL — AB (ref 8.9–10.3)
CO2: 26 mmol/L (ref 22–32)
CREATININE: 0.42 mg/dL — AB (ref 0.44–1.00)
Chloride: 106 mmol/L (ref 98–111)
GFR calc Af Amer: >60 mL/min (ref >60)
GFR calc non Af Amer: >60 mL/min (ref >60)
GLUCOSE: 104 mg/dL — AB (ref 70–99)
Potassium: 2.7 mmol/L — CL (ref 3.5–5.1)
Sodium: 139 mmol/L (ref 135–145)
Total Bilirubin: 0.4 mg/dL (ref 0.3–1.2)
Total Protein: 5.4 g/dL — ABNORMAL LOW (ref 6.5–8.1)

## 2017-12-21 LAB — CBC WITH DIFFERENTIAL/PLATELET
BASOS ABS: 0.1 10*3/uL (ref 0–0.1)
BASOS PCT: 1 %
EOS ABS: 0 10*3/uL (ref 0–0.7)
Eosinophils Relative: 1 %
HEMATOCRIT: 24 % — AB (ref 35.0–47.0)
HEMOGLOBIN: 8.3 g/dL — AB (ref 12.0–16.0)
Lymphocytes Relative: 26 %
Lymphs Abs: 1.3 10*3/uL (ref 1.0–3.6)
MCH: 28.8 pg (ref 26.0–34.0)
MCHC: 34.5 g/dL (ref 32.0–36.0)
MCV: 83.5 fL (ref 80.0–100.0)
MONOS PCT: 17 %
Monocytes Absolute: 0.8 10*3/uL (ref 0.2–0.9)
NEUTROS PCT: 55 %
Neutro Abs: 2.7 10*3/uL (ref 1.4–6.5)
Platelets: 470 10*3/uL — ABNORMAL HIGH (ref 150–440)
RBC: 2.87 MIL/uL — AB (ref 3.80–5.20)
RDW: 14.1 % (ref 11.5–14.5)
WBC: 4.9 10*3/uL (ref 3.6–11.0)

## 2017-12-21 LAB — PHOSPHORUS: Phosphorus: 3.6 mg/dL (ref 2.5–4.6)

## 2017-12-21 LAB — MAGNESIUM: Magnesium: 1.3 mg/dL — ABNORMAL LOW (ref 1.7–2.4)

## 2017-12-21 MED ORDER — KCL IN DEXTROSE-NACL 20-5-0.45 MEQ/L-%-% IV SOLN
INTRAVENOUS | Status: DC
  Administered 2017-12-21 – 2017-12-22 (×2): via INTRAVENOUS
  Filled 2017-12-21 (×3): qty 1000

## 2017-12-21 MED ORDER — POTASSIUM CHLORIDE CRYS ER 20 MEQ PO TBCR
40.0000 meq | EXTENDED_RELEASE_TABLET | Freq: Two times a day (BID) | ORAL | Status: DC
  Administered 2017-12-21 – 2017-12-22 (×3): 40 meq via ORAL
  Filled 2017-12-21 (×3): qty 2

## 2017-12-21 NOTE — Progress Notes (Signed)
SURGICAL PROGRESS NOTE  Hospital Day(s): 6.   Post op day(s):  Marland Kitchen.   Interval History: Patient seen and examined, no acute events or new complaints overnight. Patient reports she ambulated in the halls around the nurses' station x 3 yesterday and ate yesterday "more than [she's] eaten in months", including protein supplement and 1 - 2 Nepro nutritional supplement shakes, which she's been saving and eating between meals rather than with meals. She denies much pain or N/V with enteric material and gas into her ileostomy bag.  Review of Systems:  Constitutional: denies fever, chills  Respiratory: denies any shortness of breath  Cardiovascular: denies chest pain or palpitations  Gastrointestinal: abdominal pain, N/V, and bowel function as per interval history Musculoskeletal: denies pain, decreased motor or sensation Integumentary: denies any other rashes or skin discolorations except post-surgical abdominal wounds and drain  Vital signs in last 24 hours: [min-max] current  Temp:  [98 F (36.7 C)-98.5 F (36.9 C)] 98.2 F (36.8 C) (07/06 0531) Pulse Rate:  [68-69] 69 (07/06 0531) Resp:  [20] 20 (07/06 0531) BP: (104-120)/(45-53) 120/51 (07/06 0531) SpO2:  [98 %-99 %] 98 % (07/06 0531)     Height: 5\' 5"  (165.1 cm) Weight: 121 lb 7.6 oz (55.1 kg) BMI (Calculated): 20.21   Intake/Output this shift:  Total I/O In: -  Out: 250 [Urine:200; Drains:50]   Intake/Output last 2 shifts:  @IOLAST2SHIFTS @   Physical Exam:  Constitutional: alert, cooperative and no distress  Respiratory: breathing non-labored at rest  Cardiovascular: regular rate and sinus rhythm  Gastrointestinal: soft, non-tender, and non-distended with incision well-approximated without surrounding erythema or drainage and IR-placed drain well-secured likewise without surrounding erythema or drainage around her drain site  Labs:  CBC Latest Ref Rng & Units 12/21/2017 12/19/2017 12/17/2017  WBC 3.6 - 11.0 K/uL 4.9 6.1 12.1(H)   Hemoglobin 12.0 - 16.0 g/dL 8.3(L) 8.7(L) 10.7(L)  Hematocrit 35.0 - 47.0 % 24.0(L) 24.9(L) 30.7(L)  Platelets 150 - 440 K/uL 470(H) 536(H) 803(H)   CMP Latest Ref Rng & Units 12/21/2017 12/19/2017 12/17/2017  Glucose 70 - 99 mg/dL 161(W104(H) 99 960(A123(H)  BUN 6 - 20 mg/dL 9 11 15   Creatinine 0.44 - 1.00 mg/dL 5.40(J0.42(L) 8.110.69 9.140.82  Sodium 135 - 145 mmol/L 139 133(L) 128(L)  Potassium 3.5 - 5.1 mmol/L 2.7(LL) 3.6 4.8  Chloride 98 - 111 mmol/L 106 100 92(L)  CO2 22 - 32 mmol/L 26 25 24   Calcium 8.9 - 10.3 mg/dL 7.3(L) 7.4(L) 8.8(L)  Total Protein 6.5 - 8.1 g/dL 7.8(G5.4(L) 5.7(L) 7.4  Total Bilirubin 0.3 - 1.2 mg/dL 0.4 1.0 2.0(H)  Alkaline Phos 38 - 126 U/L 89 117 175(H)  AST 15 - 41 U/L 12(L) 12(L) 21  ALT 0 - 44 U/L 14 18 29    Imaging studies: No new pertinent imaging studies   Assessment/Plan: 57 y.o. female doing overall better/well 2 weeks s/p sigmoid colectomy with diverting loop ileostomy for diverticulitis with pelvic abscess and diverticular stricture, complicated by malnutrition, post-operative pelvic abscess s/p image-guided drainage (7/3), resolved leukocytosis with stable anemia, hypokalemia today, and by pertinent comorbidities including hypothyroidism, asymptomatic cardiac murmur, GERD, migraine headaches, and osteoporosis.              - regular diet with nutritional supplements encouraged             - continue to monitor abdominal exam and bowel function             - additional ambulation strongly encouraged, DVT prophylaxis as ordered             -  potassium being replaced, low-volume IV fluids changed,   - will discontinue when maintaining adequate oral hydration             - medical management of medical comorbidities  - discharge planning over next day or 2  - drain care teaching requested  All of the above findings and recommendations were discussed with the patient, patient's family, and patient's RN, and all of patient's and family's questions were answered to their  expressed satisfaction.  -- Scherrie Gerlach Earlene Plater, MD, RPVI [place]: [facility] General Surgery - Partnering for exceptional care. Office: [phone]

## 2017-12-21 NOTE — Progress Notes (Signed)
Instruction and demonstration on how to flush and empty drainage tube was provided to the patient and her oldest daughter. Both asked appropriate questions. Both also verbalized understanding. Will reinforce as well as allow for return demonstration.

## 2017-12-21 NOTE — Progress Notes (Signed)
Spoke with Dr Bosie HelperPrasanna about critical potassium results of 2.7 . New orders given . [name],[name]

## 2017-12-22 MED ORDER — AMOXICILLIN-POT CLAVULANATE 875-125 MG PO TABS: 1.0000 | ORAL_TABLET | Freq: Two times a day (BID) | ORAL | 0 refills | Status: AC

## 2017-12-22 NOTE — Progress Notes (Addendum)
Instruction on drain care, dressing change and drain emptying was provided to the patient, her husband and oldest daughter. Both husband and daughter were allowed to flush the tube with 2.295ml of saline for a total of 5ml. The dressing was then changed with husband and daughter observing. Although there was no drainage present to be emptied, husband and daughter were allowed to handle the drainage bag as if they were emptying it. Both asked appropriate questions and verbalized understanding of the information provided. 7 gauzes, tegaderm and 7 days of flushes were provided. Discharge instructions, prescription and instructions for follow up appointment was discussed with patient, husband and daughter. Printed information regarding care of drain was also provided with discharge paperwork. Once questions had been answered, all verbalized understanding of the information provided.

## 2017-12-22 NOTE — Discharge Instructions (Signed)
In addition to included general post-operative instructions for Abscess Drain,  Diet: Resume home heart healthy diet with nutritional supplements and probiotic.  Activity: No heavy lifting >20 pounds (children, pets, laundry, garbage) or strenuous activity until follow-up, but light activity and walking are encouraged. Do not drive or drink alcohol if taking narcotic pain medications.  Wound care: Drain care and record drainage as instructed. You may shower/get incision wet with soapy water and pat dry (do not rub incisions), but no baths or submerging incision underwater until follow-up and drain care + ostomy care as instructed.   Medications: Resume all home medications, including probiotic. Complete course of antibiotics as prescribed even if feeling better/well. For mild to moderate pain: acetaminophen (Tylenol) or ibuprofen/naproxen (if no kidney disease). Combining Tylenol with alcohol can substantially increase your risk of causing liver disease. Narcotic pain medications, if prescribed, can be used for severe pain, though may cause nausea, constipation, and drowsiness. Do not combine Tylenol and Percocet (or similar) within a 6 hour period as Percocet (and similar) contain(s) Tylenol. If you do not need the narcotic pain medication, you do not need to fill the prescription.  Call office [phone]) at any time if any questions, worsening pain, fevers/chills, bleeding, drainage from incision site, or other concerns.

## 2017-12-23 ENCOUNTER — Other Ambulatory Visit: Payer: Self-pay | Admitting: General Surgery

## 2017-12-23 ENCOUNTER — Other Ambulatory Visit: Payer: Self-pay

## 2017-12-23 ENCOUNTER — Telehealth: Payer: Self-pay | Admitting: *Deleted

## 2017-12-23 DIAGNOSIS — N739 Female pelvic inflammatory disease, unspecified: Secondary | ICD-10-CM

## 2017-12-23 LAB — AEROBIC/ANAEROBIC CULTURE (SURGICAL/DEEP WOUND)

## 2017-12-23 LAB — AEROBIC/ANAEROBIC CULTURE W GRAM STAIN (SURGICAL/DEEP WOUND)

## 2017-12-23 NOTE — Telephone Encounter (Signed)
Patient called and stated that she was discharged from the hospital on 12/22/17. Patient has an appointment to come in our office on 12/31/17. Patient had spoke with Dr.Davis and he stated for the patient to call here to see if she needs to be seen this week or 12/31/17 is still okay.

## 2017-12-23 NOTE — Telephone Encounter (Signed)
The patient is scheduled for a CT abdomen pelvis with oral contrast only at [facility] on 12/26/17 at 11:00 am. She will also have labs done then. She is to arrive by 10:45 am and have nothing to eat or drink for 4 hours prior. She will pick up a prep kit tomorrow. The patient will be seen here in office on 12/26/17 at 1:30 pm. She is aware of dates, times, and instructions.

## 2017-12-23 NOTE — Telephone Encounter (Signed)
Patient underwent successful drainage of a pelvic abscess on December 18, 2017.  We will arrange for a repeat CT with oral contrast on the advice of Dr. Allena KatzPatel from radiology on December 26, 2017 to assess how the abscess is resolving.  We will recheck CBC and basic metabolic panel at the same time.

## 2017-12-26 ENCOUNTER — Ambulatory Visit
Admission: RE | Admit: 2017-12-26 | Discharge: 2017-12-26 | Disposition: A | Discharge status: Home / Self Care | Payer: BC Managed Care – PPO | Source: Ambulatory Visit | Attending: General Surgery | Admitting: General Surgery

## 2017-12-26 ENCOUNTER — Encounter: Payer: Self-pay | Admitting: General Surgery

## 2017-12-26 ENCOUNTER — Ambulatory Visit (INDEPENDENT_AMBULATORY_CARE_PROVIDER_SITE_OTHER): Payer: BC Managed Care – PPO | Admitting: General Surgery

## 2017-12-26 ENCOUNTER — Other Ambulatory Visit
Admission: RE | Admit: 2017-12-26 | Discharge: 2017-12-26 | Disposition: A | Discharge status: Home / Self Care | Payer: BC Managed Care – PPO | Source: Ambulatory Visit | Attending: General Surgery | Admitting: General Surgery

## 2017-12-26 VITALS — BP 106/60 | HR 88 | Resp 18 | Ht 65.0 in | Wt 125.0 lb

## 2017-12-26 DIAGNOSIS — N739 Female pelvic inflammatory disease, unspecified: Secondary | ICD-10-CM

## 2017-12-26 DIAGNOSIS — R590 Localized enlarged lymph nodes: Secondary | ICD-10-CM | POA: Diagnosis not present

## 2017-12-26 LAB — CBC WITH DIFFERENTIAL/PLATELET
Basophils Absolute: 0.1 10*3/uL (ref 0–0.1)
Basophils Relative: 1 %
EOS ABS: 0.1 10*3/uL (ref 0–0.7)
EOS PCT: 1 %
HCT: 31.9 % — ABNORMAL LOW (ref 35.0–47.0)
Hemoglobin: 10.8 g/dL — ABNORMAL LOW (ref 12.0–16.0)
LYMPHS ABS: 1.7 10*3/uL (ref 1.0–3.6)
LYMPHS PCT: 19 %
MCH: 28.6 pg (ref 26.0–34.0)
MCHC: 33.8 g/dL (ref 32.0–36.0)
MCV: 84.6 fL (ref 80.0–100.0)
MONO ABS: 0.5 10*3/uL (ref 0.2–0.9)
MONOS PCT: 6 %
Neutro Abs: 6.9 10*3/uL — ABNORMAL HIGH (ref 1.4–6.5)
Neutrophils Relative %: 73 %
PLATELETS: 593 10*3/uL — AB (ref 150–440)
RBC: 3.77 MIL/uL — AB (ref 3.80–5.20)
RDW: 14.6 % — AB (ref 11.5–14.5)
WBC: 9.2 10*3/uL (ref 3.6–11.0)

## 2017-12-26 LAB — BASIC METABOLIC PANEL
Anion gap: 9 (ref 5–15)
BUN: 10 mg/dL (ref 6–20)
CO2: 28 mmol/L (ref 22–32)
CREATININE: 0.61 mg/dL (ref 0.44–1.00)
Calcium: 9.3 mg/dL (ref 8.9–10.3)
Chloride: 102 mmol/L (ref 98–111)
GFR calc Af Amer: >60 mL/min (ref >60)
Glucose, Bld: 97 mg/dL (ref 70–99)
POTASSIUM: 4.3 mmol/L (ref 3.5–5.1)
SODIUM: 139 mmol/L (ref 135–145)

## 2017-12-26 MED ORDER — SODIUM CHLORIDE 0.9 % IJ SOLN: 10.0000 mL | Freq: Two times a day (BID) | INTRAMUSCULAR | 1 refills | Status: DC

## 2017-12-26 NOTE — Patient Instructions (Signed)
May flush drain twice a day

## 2017-12-26 NOTE — Progress Notes (Signed)
Patient ID: [name], female   DOB: 03/13/1961, 57 y.o.   MRN: [id number]  Chief Complaint  Patient presents with  . Routine Post Op    HPI [name] is a 57 y.o. female here today for her follow up left colectomy and pelvic abscess. Appetite has been good, eating 3 meals a day. She is using Prostat mix at home. Bowels moving daily. Denies pain only soreness.  The patient denies fever or chills.  Reports marked improvement in her appetite.  Marked improvement in endurance. CT scan done today. Drain sheet present. She flushes the drain TID. She is here with her mother, Casey HubertBarbara Soto and daughter Casey SandyBeth.  HPI  Past Medical History:  Diagnosis Date  . Diverticulosis   . GERD (gastroesophageal reflux disease)   . Headache    H/O MIGRAINES  . Heart murmur    ASYMPTOMATIC  . Hypothyroidism   . Osteoporosis    borderline  . Thyroid disease    hypothyriod    Past Surgical History:  Procedure Laterality Date  . BREAST SURGERY     sterotatic biopsy on right breast.  . COLONOSCOPY  05/25/2010  . COLONOSCOPY WITH PROPOFOL N/A 12/25/2016   Flexible sigmoidoscopy, post procedure barium enema showing narrowing without mucosal abnormality in the sigmoid.  Marland Kitchen. LAPAROSCOPIC SIGMOID COLECTOMY N/A 12/02/2017   Procedure: LAPAROSCOPIC ASSISTED COLECTOMY; LOOP ILEOSTOMY;  Surgeon: Earline MayotteByrnett, Jeffrey W, MD;  Location: [facility] ORS;  Service: General;  Laterality: N/A;    Family History  Problem Relation Age of Onset  . Heart disease Father        congestive heart failure  . Hypothyroidism Sister   . Arthritis Sister        RA  . Diabetes Maternal Grandmother   . Hypothyroidism Maternal Grandmother   . Alzheimer's disease Paternal Grandmother   . Hypothyroidism Sister     Social History Social History   Tobacco Use  . Smoking status: Former Smoker    Packs/day: 0.25    Years: 15.00    Pack years: 3.75    Types: Cigarettes    Last attempt to quit: 10/16/2017    Years  since quitting: 0.1  . Smokeless tobacco: Never Used  Substance Use Topics  . Alcohol use: No  . Drug use: No    No Known Allergies  Current Outpatient Medications  Medication Sig Dispense Refill  . amoxicillin-clavulanate (AUGMENTIN) 875-125 MG tablet Take 1 tablet by mouth 2 (two) times daily for 10 days. 20 tablet 0  . ARMOUR THYROID 90 MG tablet Take 90 mg by mouth daily before breakfast.  3  . Ascorbic Acid (VITAMIN C) 1000 MG tablet Take 1,000 mg by mouth at bedtime.    . Calcium-Vitamin D-Vitamin K (CALCIUM + D + K PO) Take 1 tablet by mouth 2 (two) times daily. With lunch & at bedtime.    . Cholecalciferol (VITAMIN D3) 2000 units TABS Take 2,000 Units by mouth 2 (two) times daily. With lunch & at bedtime.    . clobetasol ointment (TEMOVATE) 0.05 % Apply 1 application topically as needed.     Marland Kitchen. ibuprofen (ADVIL,MOTRIN) 200 MG tablet Take 200 mg by mouth 3 (three) times daily as needed (for pain/headaches.).    Marland Kitchen. omeprazole (PRILOSEC OTC) 20 MG tablet Take 20 mg by mouth daily as needed (for acid reflux.).     Marland Kitchen. OVER THE COUNTER MEDICATION Patient states she is taking various other supplements/vitamins which have been held due to upcoming procedure--but does  not wish to list these items.    . Probiotic Product (PROBIOTIC PO) Take 1 capsule by mouth daily.    . sodium chloride 0.9 % injection Place 10 mLs into feeding tube every 12 (twelve) hours for 15 days. Use 5 ml 150 mL 1   No current facility-administered medications for this visit.     Review of Systems Review of Systems  Constitutional: Negative.   Respiratory: Negative.   Cardiovascular: Negative.   Gastrointestinal: Negative for constipation, diarrhea, nausea and vomiting.    Blood pressure 106/60, pulse 88, resp. rate 18, height 5\' 5"  (1.651 m), weight 125 lb (56.7 kg), SpO2 98 %.  Physical Exam Physical Exam  Constitutional: She is oriented to person, place, and time. She appears well-developed and  well-nourished.  Cardiovascular: Normal rate, regular rhythm and normal heart sounds.  Pulmonary/Chest: Effort normal and breath sounds normal.  Abdominal: Soft.  Genitourinary:  Genitourinary Comments: Right buttock drain present dressing clean and dry.  Neurological: She is alert and oriented to person, place, and time.  Skin: Skin is warm and dry.  Psychiatric: Her behavior is normal.    Data Reviewed CBC and basic metabolic panel obtained today showed normal renal function with creatinine of 0.6, potassium of 4.3, Hemoglobin is improved to 10.8 with an MCV of 84, white blood cell count of 9200 with 73% polys, 19% lymphocytes.  Platelet count modestly elevated at 593,000.  Improved from last week. CT obtained earlier today was reviewed and showed a significant improvement in the pelvic abscess.  Formal report was available after the patient's visit: IMPRESSION: 1. Persistent air and fluid-filled collection within the pelvis both anterior and posterior to the uterus as well as extending into the left pericolic gutter. The collection is moderately decreased in size and decreased in fluid content. Exact delineation is suboptimal without intravenous contrast. Drainage catheter in situ. 2. Diffuse reactive inflammation throughout the left hemiabdomen and pelvis with bowel wall thickening and mesenteric adenopathy.  Drainage returned is minimal.  Cultures showed strep viridans and enterococcus.  Assessment    Marked improvement post catheter drainage of pelvic abscess.  We will continue present antibiotic therapy until a follow-up scan in 10 days.    Plan  May flush drain BID RTW 01-30-18 Follow up TBA   HPI, Physical Exam, Assessment and Plan have been scribed under the direction and in the presence of [name], MD. Dorathy Daft, RN  I have completed the exam and reviewed the above documentation for accuracy and completeness.  I agree with the above.  Dentist has been used and any errors in dictation or transcription are unintentional.  Donnalee Curry, M.D., F.A.C.S.   Merrily Pew Byrnett 12/27/2017, 6:02 AM

## 2017-12-27 ENCOUNTER — Telehealth: Payer: Self-pay

## 2017-12-27 ENCOUNTER — Other Ambulatory Visit: Payer: Self-pay

## 2017-12-27 DIAGNOSIS — N739 Female pelvic inflammatory disease, unspecified: Secondary | ICD-10-CM

## 2017-12-27 MED ORDER — AMOXICILLIN-POT CLAVULANATE 875-125 MG PO TABS: 1.0000 | ORAL_TABLET | Freq: Two times a day (BID) | ORAL | 0 refills | Status: AC

## 2017-12-27 NOTE — Telephone Encounter (Signed)
Notified patient as instructed, patient pleased. Discussed follow-up appointments, patient agrees. Patient is scheduled for a CT abdomen/pelvis with oral and iv contrast at [facility] outpatient imaging on 01/09/18 at 8:00 am. She will arrive by 7:45 am and have nothing to eat or drink for 4 hours prior. She will pick up a prep kit prior. The patient is aware of date, time, and instructions.

## 2017-12-27 NOTE — Telephone Encounter (Signed)
-----   Message from Earline MayotteJeffrey W Byrnett, MD sent at 12/27/2017  6:09 AM EDT ----- Please notify the patient had like her to stay on Augmentin and I sent a refill to  her pharmacy.  Arrange for a repeat CT scan of the abdomen and pelvis with IV and oral contrast for the week of July 22 with an office visit same day if possible.  Please asked the patient to eat one tub of yogurt daily if possible to minimize GI effects of the antibiotics.  Thank you

## 2017-12-30 ENCOUNTER — Ambulatory Visit: Payer: BC Managed Care – PPO

## 2017-12-31 ENCOUNTER — Telehealth: Payer: Self-pay | Admitting: General Surgery

## 2017-12-31 ENCOUNTER — Ambulatory Visit (INDEPENDENT_AMBULATORY_CARE_PROVIDER_SITE_OTHER): Payer: BC Managed Care – PPO | Admitting: *Deleted

## 2017-12-31 ENCOUNTER — Ambulatory Visit: Payer: BC Managed Care – PPO | Admitting: General Surgery

## 2017-12-31 DIAGNOSIS — K56699 Other intestinal obstruction unspecified as to partial versus complete obstruction: Secondary | ICD-10-CM

## 2017-12-31 NOTE — Telephone Encounter (Signed)
Patient called and would like to check on [name] receiving paperwork for her colostomy supplies.  The company has not received the paperwork and she was suppose to receive them this week. Please call

## 2017-12-31 NOTE — Telephone Encounter (Signed)
She states they have added products ( paste and odor eliminator) to the list of supplies and will be sending to office for approval by MD.

## 2017-12-31 NOTE — Patient Instructions (Signed)
The patient is aware to call back for any questions or concerns.  

## 2017-12-31 NOTE — Progress Notes (Signed)
Patient ID: [name], female   DOB: 09/03/1960, 57 y.o.   MRN: [id number] Patient came in today asking questions regarding her ostomy bag. She states that this weekend the stool was bulky and the wafer/bag was leaking. She states that she does notice that different foods she eats triggers the stool to be of different consistency, I agreed and told her that she could see even more changes. She had called [employer] and blue [employer] nurses as well.  She has gotten a few concave wafers to try and has some stoma paste as well. Discussed options for application with her and her husband in detail.

## 2018-01-01 NOTE — Discharge Summary (Signed)
Physician Discharge Summary  Patient ID: [name] MRN: [id number] DOB/AGE: 58/10/1960 57 y.o.  Admit date: 12/18/2017 Discharge date: 12/22/2017  Admission Diagnoses:  Discharge Diagnoses:  Active Problems:   Pelvic abscess in female   Malnutrition of moderate degree   Discharged Condition: good  Hospital Course: 57 y.o. female [facility] after being seen in outpatient surgery office for follow-up s/p sigmoid colon resection with diverting loop ileostomy (Byrnett, 12/02/2017). Patient was at that time experiencing occasional chills, poor appetite, and weight loss and was found upon workup to have recurrent leukocytosis and CT demonstrating bilobed pelvic abscess. Upon admission, patient patient underwent image-guided IR drainage of her pelvic abscess, after which her leukocytosis resolved, patient's appetite gradually improved/resolved, and advancement of patient's diet and ambulation were well-tolerated. The remainder of patient's hospital course was essentially unremarkable, and discharge planning was initiated accordingly with patient safely able to be discharged home with appropriate discharge instructions, antibiotics, pain control, and outpatient surgical follow-up after all of her and family's questions were answered to their expressed satisfaction.  Consults: None  Treatments: IV hydration, antibiotics: Zosyn and procedures: percutaneous drainage catheter placement  Discharge Exam: Blood pressure (!) 119/52, pulse 65, temperature 97.9 F (36.6 C), temperature source Oral, resp. rate 18, height 5\' 5"  (1.651 m), weight 121 lb 7.6 oz (55.1 kg), SpO2 100 %. General appearance: alert, cooperative and no distress GI: abdomen soft and non-distended with mild peri-drain tenderness to palpation, post-surgical abdominal wounds well-approximated without erythema or drainage, and percutaneous-placed abscess drain well-secured with purulent drainage  Disposition:     Allergies as of 12/22/2017   No Known Allergies     Medication List    TAKE these medications   amoxicillin-clavulanate 875-125 MG tablet Commonly known as:  AUGMENTIN Take 1 tablet by mouth 2 (two) times daily for 10 days.   ARMOUR THYROID 90 MG tablet Generic drug:  thyroid Take 90 mg by mouth daily before breakfast.   CALCIUM + D + K PO Take 1 tablet by mouth 2 (two) times daily. With lunch & at bedtime.   clobetasol ointment 0.05 % Commonly known as:  TEMOVATE Apply 1 application topically as needed.   ibuprofen 200 MG tablet Commonly known as:  ADVIL,MOTRIN Take 200 mg by mouth 3 (three) times daily as needed (for pain/headaches.).   OVER THE COUNTER MEDICATION Patient states she is taking various other supplements/vitamins which have been held due to upcoming procedure--but does not wish to list these items.   PRILOSEC OTC 20 MG tablet Generic drug:  omeprazole Take 20 mg by mouth daily as needed (for acid reflux.).   PROBIOTIC PO Take 1 capsule by mouth daily.   vitamin C 1000 MG tablet Take 1,000 mg by mouth at bedtime.   Vitamin D3 2000 units Tabs Take 2,000 Units by mouth 2 (two) times daily. With lunch & at bedtime.      Follow-up Information    Byrnett, Merrily PewJeffrey W, MD. Schedule an appointment as soon as possible for a visit in 1 week(s).   Specialties:  General Surgery, Radiology Contact information: [address] [place] [address] [phone]           Signed: Ancil LinseyJason Evan Toneisha Savary 01/01/2018, 9:25 PM

## 2018-01-06 ENCOUNTER — Telehealth: Payer: Self-pay

## 2018-01-06 ENCOUNTER — Other Ambulatory Visit: Payer: Self-pay

## 2018-01-06 DIAGNOSIS — N739 Female pelvic inflammatory disease, unspecified: Secondary | ICD-10-CM

## 2018-01-06 NOTE — Telephone Encounter (Signed)
Patient called and said that her drain has been leaking around the tube. It is the same color as what is coming from her drain. She reports that she had a fever of 100 yesterday but none today although she has been sweating. She reports some increased soreness at the drain site. The area of leakage is about a half dollar size. Notified Dr Lemar LivingsByrnett and we will move her CT up to tomorrow with a CBC to be done and then follow up in the office afterwards that day.

## 2018-01-06 NOTE — Telephone Encounter (Signed)
Called patient back and let her know that Dr Lemar LivingsByrnett wanted her to have her CT scan done tomorrow instead of Thursday along with labs and to follow up in office afterwards. The patient is scheduled for CT abdomen pelvis at [facility] at 10:30 am. She is to arrive by 10:15 am and have nothing to eat or drink for 4 hours prior. She will start drinking her contrast first bottle at 8:30 am and her second bottle at 9:30 am. She will also have labs done while she is there. She will follow up with Dr Lemar LivingsByrnett that day in office at 1:00 pm. The patient is aware of date, time, and instructions.

## 2018-01-07 ENCOUNTER — Ambulatory Visit
Admission: RE | Admit: 2018-01-07 | Discharge: 2018-01-07 | Disposition: A | Discharge status: Home / Self Care | Payer: BC Managed Care – PPO | Source: Ambulatory Visit | Attending: General Surgery | Admitting: General Surgery

## 2018-01-07 ENCOUNTER — Ambulatory Visit (INDEPENDENT_AMBULATORY_CARE_PROVIDER_SITE_OTHER): Payer: BC Managed Care – PPO | Admitting: General Surgery

## 2018-01-07 ENCOUNTER — Encounter: Payer: Self-pay | Admitting: General Surgery

## 2018-01-07 ENCOUNTER — Other Ambulatory Visit
Admission: RE | Admit: 2018-01-07 | Discharge: 2018-01-07 | Disposition: A | Discharge status: Home / Self Care | Payer: BC Managed Care – PPO | Source: Ambulatory Visit | Attending: General Surgery | Admitting: General Surgery

## 2018-01-07 ENCOUNTER — Ambulatory Visit: Payer: BC Managed Care – PPO

## 2018-01-07 VITALS — BP 124/80 | HR 111 | Temp 98.7°F | Resp 24 | Ht 65.0 in | Wt 120.0 lb

## 2018-01-07 DIAGNOSIS — Z9689 Presence of other specified functional implants: Secondary | ICD-10-CM

## 2018-01-07 DIAGNOSIS — Z932 Ileostomy status: Secondary | ICD-10-CM | POA: Insufficient documentation

## 2018-01-07 DIAGNOSIS — N739 Female pelvic inflammatory disease, unspecified: Secondary | ICD-10-CM

## 2018-01-07 DIAGNOSIS — T82338A Leakage of other vascular grafts, initial encounter: Secondary | ICD-10-CM

## 2018-01-07 DIAGNOSIS — K56699 Other intestinal obstruction unspecified as to partial versus complete obstruction: Secondary | ICD-10-CM

## 2018-01-07 LAB — CBC WITH DIFFERENTIAL/PLATELET
Basophils Absolute: 0 10*3/uL (ref 0–0.1)
Basophils Relative: 0 %
EOS PCT: 0 %
Eosinophils Absolute: 0 10*3/uL (ref 0–0.7)
HEMATOCRIT: 30.7 % — AB (ref 35.0–47.0)
Hemoglobin: 10.7 g/dL — ABNORMAL LOW (ref 12.0–16.0)
Lymphocytes Relative: 9 %
Lymphs Abs: 0.5 10*3/uL — ABNORMAL LOW (ref 1.0–3.6)
MCH: 29.4 pg (ref 26.0–34.0)
MCHC: 34.9 g/dL (ref 32.0–36.0)
MCV: 84.2 fL (ref 80.0–100.0)
MONO ABS: 1 10*3/uL — AB (ref 0.2–0.9)
MONOS PCT: 17 %
NEUTROS ABS: 4.4 10*3/uL (ref 1.4–6.5)
Neutrophils Relative %: 74 %
PLATELETS: 481 10*3/uL — AB (ref 150–440)
RBC: 3.64 MIL/uL — ABNORMAL LOW (ref 3.80–5.20)
RDW: 16.6 % — AB (ref 11.5–14.5)
WBC: 6 10*3/uL (ref 3.6–11.0)

## 2018-01-07 MED ORDER — IOPAMIDOL (ISOVUE-300) INJECTION 61%
100.0000 mL | Freq: Once | INTRAVENOUS | Status: AC | PRN
  Administered 2018-01-07: 150 mL via INTRAVENOUS

## 2018-01-07 NOTE — Progress Notes (Signed)
Patient ID: [name], female   DOB: [birth year], 57 y.o.   MRN: [id number]  Chief Complaint  Patient presents with  . Routine Post Op    HPI [name] is a 57 y.o. female.  here today for her follow up left colectomy, loop ileostomy and pelvic abscess. Drain sheet present. Appetite has been good, eating 3 meals a day. She is using Prostat mix at home. Weight down 5 pounds. Bowels moving daily. She states she had fever on Saturday. She admits to chills and diaphoresis. Denies pain, only soreness.  Husband said it seemed to be leaking more around the drain and has a stronger odor. She is here with her mother, [name].  CT scan was this morning.  HPI  Past Medical History:  Diagnosis Date  . Diverticulosis   . GERD (gastroesophageal reflux disease)   . Headache    H/O MIGRAINES  . Heart murmur    ASYMPTOMATIC  . Hypothyroidism   . Osteoporosis    borderline  . Thyroid disease    hypothyriod    Past Surgical History:  Procedure Laterality Date  . BREAST SURGERY     sterotatic biopsy on right breast.  . COLONOSCOPY  05/25/2010  . COLONOSCOPY WITH PROPOFOL N/A 12/25/2016   Flexible sigmoidoscopy, post procedure barium enema showing narrowing without mucosal abnormality in the sigmoid.  Marland Kitchen LAPAROSCOPIC SIGMOID COLECTOMY N/A 12/02/2017   Procedure: LAPAROSCOPIC ASSISTED COLECTOMY; LOOP ILEOSTOMY;  Surgeon: [name], MD;  Location: [facility] ORS;  Service: General;  Laterality: N/A;    Family History  Problem Relation Age of Onset  . Heart disease Father        congestive heart failure  . Hypothyroidism Sister   . Arthritis Sister        RA  . Diabetes Maternal Grandmother   . Hypothyroidism Maternal Grandmother   . Alzheimer's disease Paternal Grandmother   . Hypothyroidism Sister     Social History Social History   Tobacco Use  . Smoking status: Former Smoker    Packs/day: 0.25    Years: 15.00    Pack years: 3.75    Types:  Cigarettes    Last attempt to quit: 10/16/2017    Years since quitting: 0.2  . Smokeless tobacco: Never Used  Substance Use Topics  . Alcohol use: No  . Drug use: No    No Known Allergies  Current Outpatient Medications  Medication Sig Dispense Refill  . ARMOUR THYROID 90 MG tablet Take 90 mg by mouth daily before breakfast.  3  . Ascorbic Acid (VITAMIN C) 1000 MG tablet Take 1,000 mg by mouth at bedtime.    . Calcium-Vitamin D-Vitamin K (CALCIUM + D + K PO) Take 1 tablet by mouth 2 (two) times daily. With lunch & at bedtime.    . Cholecalciferol (VITAMIN D3) 2000 units TABS Take 2,000 Units by mouth 2 (two) times daily. With lunch & at bedtime.    . clobetasol ointment (TEMOVATE) 0.05 % Apply 1 application topically as needed.     Marland Kitchen ibuprofen (ADVIL,MOTRIN) 200 MG tablet Take 200 mg by mouth 3 (three) times daily as needed (for pain/headaches.).    Marland Kitchen omeprazole (PRILOSEC OTC) 20 MG tablet Take 20 mg by mouth daily as needed (for acid reflux.).     Marland Kitchen OVER THE COUNTER MEDICATION Patient states she is taking various other supplements/vitamins which have been held due to upcoming procedure--but does not wish to list these items.    Marland Kitchen  Probiotic Product (PROBIOTIC PO) Take 1 capsule by mouth daily.    . sodium chloride 0.9 % injection Place 10 mLs into feeding tube every 12 (twelve) hours for 15 days. Use 5 ml 150 mL 1   No current facility-administered medications for this visit.     Review of Systems Review of Systems  Constitutional: Positive for chills.  Respiratory: Negative.   Cardiovascular: Negative.     Blood pressure 124/80, pulse (!) 111, temperature 98.7 F (37.1 C), temperature source Oral, resp. rate (!) 24, height 5\' 5"  (1.651 m), weight 120 lb (54.4 kg), SpO2 98 %.  Physical Exam Physical Exam  Constitutional: She is oriented to person, place, and time. She appears well-developed and well-nourished.  HENT:  Mouth/Throat: Oropharynx is clear and moist.  Eyes: No  scleral icterus.  Neck: Neck supple.  Cardiovascular: Normal rate, regular rhythm and normal heart sounds.  Pulmonary/Chest: Effort normal and breath sounds normal.  Abdominal:    Genitourinary:  Genitourinary Comments: Dressing changed to drain site  Neurological: She is alert and oriented to person, place, and time.  Skin: Skin is warm and dry.  Psychiatric: Her behavior is normal.    Data Reviewed CT images reviewed at the time of the patient's visit suggested the catheter drainage was appropriately placed but there is still a large pelvic abscess with air and fluid.  This was confirmed on formal report. CBC completed today showed hemoglobin stable at 10.7, white blood cell count 6000 with a minimal left shift.  Platelet count improved at 481,000.  Assessment    Suspected an anastomotic disruption with ongoing pelvic sepsis in spite of normal white count.    Plan     We will arrange for a limited barium enema to confirm the clinical impression of a disrupted anastomosis.       HPI, Physical Exam, Assessment and Plan have been scribed under the direction and in the presence of Earline MayotteJeffrey W. Sehar Sedano, MD. Dorathy DaftMarsha Hatch, RN  I have completed the exam and reviewed the above documentation for accuracy and completeness.  I agree with the above.  [employer] has been used and any errors in dictation or transcription are unintentional.  Donnalee CurryJeffrey Lamis Behrmann, M.D., F.A.C.S.  Merrily PewJeffrey W Jodette Wik 01/08/2018, 9:36 PM

## 2018-01-07 NOTE — Patient Instructions (Signed)
The patient is aware to call back for any questions or new concerns.  

## 2018-01-08 ENCOUNTER — Other Ambulatory Visit: Payer: Self-pay | Admitting: Family Medicine

## 2018-01-08 ENCOUNTER — Telehealth: Payer: Self-pay

## 2018-01-08 ENCOUNTER — Other Ambulatory Visit: Payer: Self-pay

## 2018-01-08 DIAGNOSIS — N739 Female pelvic inflammatory disease, unspecified: Secondary | ICD-10-CM

## 2018-01-08 DIAGNOSIS — K56699 Other intestinal obstruction unspecified as to partial versus complete obstruction: Secondary | ICD-10-CM

## 2018-01-08 NOTE — Telephone Encounter (Signed)
Spoke with patient about having the bowel study and she is amendable to this. The patient is scheduled [facility] at 9:00 am. She is to arrive there by 8:30 am. She will have clear liquids only starting tomorrow(01/09/18) at 9:00 am and will have nothing by mouth starting at midnight. The patient is aware of date, time, and instructions.

## 2018-01-08 NOTE — Telephone Encounter (Signed)
-----   Message from Earline MayotteJeffrey W Byrnett, MD sent at 01/08/2018 12:25 PM EDT ----- See if you can arrange for a limited gastrografin enema this week. I would like to be present for the study, even if it means going between office patients or OR cases. Thanks.

## 2018-01-09 ENCOUNTER — Ambulatory Visit: Payer: BC Managed Care – PPO

## 2018-01-09 ENCOUNTER — Ambulatory Visit: Payer: BC Managed Care – PPO | Admitting: General Surgery

## 2018-01-10 ENCOUNTER — Encounter: Payer: Self-pay | Admitting: *Deleted

## 2018-01-10 ENCOUNTER — Ambulatory Visit: Admission: RE | Admit: 2018-01-10 | Payer: BC Managed Care – PPO | Source: Ambulatory Visit

## 2018-01-10 ENCOUNTER — Inpatient Hospital Stay
Admission: AD | Admit: 2018-01-10 | Discharge: 2018-01-15 | DRG: 858 | Disposition: A | Discharge status: Home Health | Payer: BC Managed Care – PPO | Attending: General Surgery | Admitting: General Surgery

## 2018-01-10 ENCOUNTER — Ambulatory Visit
Admission: RE | Admit: 2018-01-10 | Discharge: 2018-01-10 | Disposition: A | Discharge status: Home / Self Care | Payer: BC Managed Care – PPO | Source: Ambulatory Visit | Attending: General Surgery | Admitting: General Surgery

## 2018-01-10 ENCOUNTER — Other Ambulatory Visit: Payer: Self-pay | Admitting: General Surgery

## 2018-01-10 ENCOUNTER — Other Ambulatory Visit: Payer: Self-pay

## 2018-01-10 DIAGNOSIS — Z933 Colostomy status: Secondary | ICD-10-CM | POA: Diagnosis not present

## 2018-01-10 DIAGNOSIS — E876 Hypokalemia: Secondary | ICD-10-CM | POA: Diagnosis not present

## 2018-01-10 DIAGNOSIS — L0291 Cutaneous abscess, unspecified: Secondary | ICD-10-CM

## 2018-01-10 DIAGNOSIS — R194 Change in bowel habit: Secondary | ICD-10-CM

## 2018-01-10 DIAGNOSIS — Z79890 Hormone replacement therapy (postmenopausal): Secondary | ICD-10-CM

## 2018-01-10 DIAGNOSIS — E039 Hypothyroidism, unspecified: Secondary | ICD-10-CM | POA: Diagnosis present

## 2018-01-10 DIAGNOSIS — N739 Female pelvic inflammatory disease, unspecified: Secondary | ICD-10-CM | POA: Diagnosis present

## 2018-01-10 DIAGNOSIS — B952 Enterococcus as the cause of diseases classified elsewhere: Secondary | ICD-10-CM | POA: Diagnosis present

## 2018-01-10 DIAGNOSIS — K660 Peritoneal adhesions (postprocedural) (postinfection): Secondary | ICD-10-CM | POA: Diagnosis present

## 2018-01-10 DIAGNOSIS — K56699 Other intestinal obstruction unspecified as to partial versus complete obstruction: Secondary | ICD-10-CM | POA: Insufficient documentation

## 2018-01-10 DIAGNOSIS — Y832 Surgical operation with anastomosis, bypass or graft as the cause of abnormal reaction of the patient, or of later complication, without mention of misadventure at the time of the procedure: Secondary | ICD-10-CM | POA: Diagnosis not present

## 2018-01-10 DIAGNOSIS — Z87891 Personal history of nicotine dependence: Secondary | ICD-10-CM

## 2018-01-10 DIAGNOSIS — Y92009 Unspecified place in unspecified non-institutional (private) residence as the place of occurrence of the external cause: Secondary | ICD-10-CM

## 2018-01-10 DIAGNOSIS — T8143XA Infection following a procedure, organ and space surgical site, initial encounter: Principal | ICD-10-CM | POA: Diagnosis present

## 2018-01-10 MED ORDER — SODIUM CHLORIDE 0.9 % IV SOLN: 250.0000 mL | INTRAVENOUS | Status: DC | PRN

## 2018-01-10 MED ORDER — AMOXICILLIN-POT CLAVULANATE 875-125 MG PO TABS
1.0000 | ORAL_TABLET | Freq: Two times a day (BID) | ORAL | Status: AC
  Administered 2018-01-10: 1 via ORAL
  Filled 2018-01-10: qty 1

## 2018-01-10 MED ORDER — ACETAMINOPHEN 325 MG PO TABS: 650.0000 mg | ORAL_TABLET | ORAL | Status: DC | PRN

## 2018-01-10 MED ORDER — ONDANSETRON HCL 4 MG/2ML IJ SOLN: 4.0000 mg | Freq: Four times a day (QID) | INTRAMUSCULAR | Status: DC | PRN

## 2018-01-10 MED ORDER — ALVIMOPAN 12 MG PO CAPS
12.0000 mg | ORAL_CAPSULE | ORAL | Status: AC
  Administered 2018-01-11: 12 mg via ORAL
  Filled 2018-01-10: qty 1

## 2018-01-10 MED ORDER — LACTATED RINGERS IV SOLN
INTRAVENOUS | Status: DC
  Administered 2018-01-11 (×2): via INTRAVENOUS

## 2018-01-10 MED ORDER — IBUPROFEN 400 MG PO TABS: 200.0000 mg | ORAL_TABLET | Freq: Three times a day (TID) | ORAL | Status: DC | PRN

## 2018-01-10 MED ORDER — IOTHALAMATE MEGLUMINE 17.2 % UR SOLN
250.0000 mL | Freq: Once | URETHRAL | Status: AC | PRN
  Administered 2018-01-10: 1500 mL

## 2018-01-10 MED ORDER — MORPHINE SULFATE (PF) 2 MG/ML IV SOLN: 2.0000 mg | INTRAVENOUS | Status: DC | PRN

## 2018-01-10 MED ORDER — THYROID 60 MG PO TABS
90.0000 mg | ORAL_TABLET | Freq: Every day | ORAL | Status: DC
  Filled 2018-01-10: qty 1

## 2018-01-10 MED ORDER — SODIUM CHLORIDE 0.9% FLUSH
3.0000 mL | Freq: Two times a day (BID) | INTRAVENOUS | Status: DC
  Administered 2018-01-10: 3 mL via INTRAVENOUS

## 2018-01-10 MED ORDER — ONDANSETRON HCL 4 MG PO TABS: 4.0000 mg | ORAL_TABLET | Freq: Four times a day (QID) | ORAL | Status: DC | PRN

## 2018-01-10 MED ORDER — SODIUM CHLORIDE 0.9 % IV SOLN
1.0000 g | Freq: Once | INTRAVENOUS | Status: AC
  Administered 2018-01-11: 1000 mg via INTRAVENOUS
  Filled 2018-01-10: qty 1

## 2018-01-10 MED ORDER — SODIUM CHLORIDE 0.9% FLUSH: 3.0000 mL | INTRAVENOUS | Status: DC | PRN

## 2018-01-10 NOTE — Progress Notes (Signed)
Gastrograffin enema suggests an anastomotic leak.  CT to assess

## 2018-01-10 NOTE — H&P (Signed)
[name] is an 57 y.o. female.   Chief Complaint: Pelvic abscess HPI:  5 weeks post sigmoid resection for severe chronic diverticulitis. Developed a post operative pelvic abscess, drained by IR. Continue fluid collection, although WBC normalized. Gastrograffin enema and f/u CT shows anastomotic leak. IR drain in cavity, but minimal decompression. For operative drainage.   Past Medical History:  Diagnosis Date  . Diverticulosis   . GERD (gastroesophageal reflux disease)   . Headache    H/O MIGRAINES  . Heart murmur    ASYMPTOMATIC  . Hypothyroidism   . Osteoporosis    borderline  . Thyroid disease    hypothyriod    Past Surgical History:  Procedure Laterality Date  . BREAST SURGERY     sterotatic biopsy on right breast.  . COLONOSCOPY  05/25/2010  . COLONOSCOPY WITH PROPOFOL N/A 12/25/2016   Flexible sigmoidoscopy, post procedure barium enema showing narrowing without mucosal abnormality in the sigmoid.  Marland Kitchen LAPAROSCOPIC SIGMOID COLECTOMY N/A 12/02/2017   Procedure: LAPAROSCOPIC ASSISTED COLECTOMY; LOOP ILEOSTOMY;  Surgeon: [name], MD;  Location: [facility] ORS;  Service: General;  Laterality: N/A;    Family History  Problem Relation Age of Onset  . Heart disease Father        congestive heart failure  . Hypothyroidism Sister   . Arthritis Sister        RA  . Diabetes Maternal Grandmother   . Hypothyroidism Maternal Grandmother   . Alzheimer's disease Paternal Grandmother   . Hypothyroidism Sister    Social History:  reports that she quit smoking about 2 months ago. Her smoking use included cigarettes. She has a 3.75 pack-year smoking history. She has never used smokeless tobacco. She reports that she does not drink alcohol or use drugs.  Allergies: No Known Allergies  Medications Prior to Admission  Medication Sig Dispense Refill  . ARMOUR THYROID 90 MG tablet Take 90 mg by mouth daily before breakfast.  3  . Ascorbic Acid (VITAMIN C) 1000 MG tablet Take  1,000 mg by mouth at bedtime.    . Calcium-Vitamin D-Vitamin K (CALCIUM + D + K PO) Take 1 tablet by mouth 2 (two) times daily. With lunch & at bedtime.    . Cholecalciferol (VITAMIN D3) 2000 units TABS Take 2,000 Units by mouth 2 (two) times daily. With lunch & at bedtime.    . clobetasol ointment (TEMOVATE) 0.05 % Apply 1 application topically as needed.     Marland Kitchen ibuprofen (ADVIL,MOTRIN) 200 MG tablet Take 200 mg by mouth 3 (three) times daily as needed (for pain/headaches.).    Marland Kitchen omeprazole (PRILOSEC OTC) 20 MG tablet Take 20 mg by mouth daily as needed (for acid reflux.).     Marland Kitchen OVER THE COUNTER MEDICATION Patient states she is taking various other supplements/vitamins which have been held due to upcoming procedure--but does not wish to list these items.    . Probiotic Product (PROBIOTIC PO) Take 1 capsule by mouth daily.    . sodium chloride 0.9 % injection Place 10 mLs into feeding tube every 12 (twelve) hours for 15 days. Use 5 ml 150 mL 1    No results found for this or any previous visit (from the past 48 hour(s)). Ct Abdomen Pelvis Wo Contrast  Result Date: 01/10/2018 CLINICAL DATA:  Evaluate for anastomotic leak. EXAM: CT ABDOMEN AND PELVIS WITHOUT CONTRAST TECHNIQUE: Limited Multidetector CT imaging of the abdomen and pelvis was performed following the standard protocol without IV contrast. COMPARISON:  Prior water-soluble  barium enema performed just prior to this scan. CT 01/07/2018. FINDINGS: Lower chest: Lung bases are clear. No focal abnormality. No change from prior exam. No interim change. Hepatobiliary: No focal hepatic abnormality identified. Gallbladder is nondistended. No interim change. Pancreas: No focal abnormality.  No interim change. Spleen: Normal size.  No focal abnormality.  No interim change. Adrenals/Urinary Tract: Stable nodular adrenal glands of. Kidneys are unremarkable. No hydronephrosis. No interim change Stomach/Bowel: As noted on just performed water-soluble enema  anastomotic leakage is noted into a perirectal cavity. Drainage catheter is within this perirectal cavity. Vascular/Lymphatic: Atherosclerotic vascular calcifications over the abdominal aorta and iliac arteries. Small pelvic lymph nodes noted. No interim change Reproductive: No focal abnormality.  No interim change Other: None. Musculoskeletal: No significant bony abnormality. No interim change. IMPRESSION: Limited CT of the abdomen pelvis performed following water-soluble enema. As noted on just performed water-soluble enema anastomotic leakage is noted into a perirectal cavity. A drainage catheter is present within the perirectal cavity. No other interim change from prior recent exam of 01/07/2018. Electronically Signed   By: Maisie Fushomas  Register   On: 01/10/2018 11:46   Dg Colon W/cm Ltd  Result Date: 01/10/2018 CLINICAL DATA:  Stricture sigmoid colon.  Evaluate for bowel leak. EXAM: BE LIMITED WITH CONTRAST CONTRAST:  Water-soluble. FLUOROSCOPY TIME:  Fluoroscopy Time:  5 minutes 48 seconds Radiation Exposure Index (if provided by the fluoroscopic device): 147.9 mGy Number of Acquired Spot Images: 23 COMPARISON:  CT 01/07/2018. FINDINGS: Limited exam due to patient's clinical condition. Contrast fills the rectum and courses through sigmoid anastomosis and into the left colon. Contrast fills the previously identified perirectal cavity. Drainage tube is again noted within this cavity. IMPRESSION: Postsurgical changes with contrast leaking from the sigmoid colon into a perirectal cavity. Drainage is within the cavity. No evidence of colonic obstruction. Electronically Signed   By: Maisie Fushomas  Register   On: 01/10/2018 10:49    ROS  Blood pressure 108/69, pulse 92, temperature 98.1 F (36.7 C), temperature source Oral, resp. rate 20, SpO2 98 %. Physical Exam  Constitutional: She is oriented to person, place, and time. She appears well-developed.  Eyes: Pupils are equal, round, and reactive to light.  Neck:  Neck supple. No thyromegaly present.  Cardiovascular: Normal rate and regular rhythm.  Respiratory: Effort normal and breath sounds normal.  GI: Soft. Bowel sounds are normal. She exhibits no distension. There is no tenderness.  Musculoskeletal: Normal range of motion.  Neurological: She is alert and oriented to person, place, and time.  Skin: Skin is warm and dry.  Psychiatric: She has a normal mood and affect. Her behavior is normal. Judgment and thought content normal.     Assessment/Plan The drain placed by interventional radiology has not provided resolution of the leak/abscess.  Indications for operative drainage reviewed.  My hope is that wide drainage will allow the anastomosis to heal.  If it is completely disrupted a temporary stoma would be the best management routine to minimize ongoing pelvic sepsis.  Pros and cons of operative intervention have been reviewed with the patient and her husband and she is amenable to proceed.  Earline MayotteJeffrey W Richanda Darin, MD 01/10/2018, 8:21 PM

## 2018-01-11 ENCOUNTER — Encounter
Admission: AD | Disposition: A | Discharge status: Home Health | Payer: Self-pay | Source: Home / Self Care | Attending: General Surgery

## 2018-01-11 ENCOUNTER — Encounter: Payer: Self-pay | Admitting: *Deleted

## 2018-01-11 ENCOUNTER — Inpatient Hospital Stay: Payer: BC Managed Care – PPO | Admitting: Anesthesiology

## 2018-01-11 ENCOUNTER — Ambulatory Visit: Admission: RE | Admit: 2018-01-11 | Payer: BC Managed Care – PPO | Source: Ambulatory Visit | Admitting: General Surgery

## 2018-01-11 HISTORY — PX: LAPAROTOMY: SHX154

## 2018-01-11 HISTORY — PX: INCISION AND DRAINAGE ABSCESS: SHX5864

## 2018-01-11 LAB — COMPREHENSIVE METABOLIC PANEL
ALK PHOS: 78 U/L (ref 38–126)
ALT: 13 U/L (ref 0–44)
ANION GAP: 8 (ref 5–15)
AST: 15 U/L (ref 15–41)
Albumin: 2.6 g/dL — ABNORMAL LOW (ref 3.5–5.0)
BILIRUBIN TOTAL: 0.6 mg/dL (ref 0.3–1.2)
BUN: 11 mg/dL (ref 6–20)
CALCIUM: 7.9 mg/dL — AB (ref 8.9–10.3)
CO2: 27 mmol/L (ref 22–32)
CREATININE: 0.8 mg/dL (ref 0.44–1.00)
Chloride: 99 mmol/L (ref 98–111)
Glucose, Bld: 108 mg/dL — ABNORMAL HIGH (ref 70–99)
Potassium: 3.1 mmol/L — ABNORMAL LOW (ref 3.5–5.1)
Sodium: 134 mmol/L — ABNORMAL LOW (ref 135–145)
TOTAL PROTEIN: 6.6 g/dL (ref 6.5–8.1)

## 2018-01-11 LAB — CBC
HCT: 29.7 % — ABNORMAL LOW (ref 35.0–47.0)
Hemoglobin: 10.7 g/dL — ABNORMAL LOW (ref 12.0–16.0)
MCH: 30.1 pg (ref 26.0–34.0)
MCHC: 35.9 g/dL (ref 32.0–36.0)
MCV: 83.8 fL (ref 80.0–100.0)
Platelets: 559 10*3/uL — ABNORMAL HIGH (ref 150–440)
RBC: 3.54 MIL/uL — AB (ref 3.80–5.20)
RDW: 16.7 % — ABNORMAL HIGH (ref 11.5–14.5)
WBC: 5.8 10*3/uL (ref 3.6–11.0)

## 2018-01-11 LAB — SURGICAL PCR SCREEN
MRSA, PCR: NEGATIVE
STAPHYLOCOCCUS AUREUS: NEGATIVE

## 2018-01-11 LAB — TSH: TSH: 6.652 u[IU]/mL — ABNORMAL HIGH (ref 0.350–4.500)

## 2018-01-11 LAB — T4, FREE: FREE T4: 0.96 ng/dL (ref 0.82–1.77)

## 2018-01-11 SURGERY — LAPAROTOMY, EXPLORATORY
Anesthesia: General

## 2018-01-11 MED ORDER — PROPOFOL 10 MG/ML IV BOLUS
INTRAVENOUS | Status: DC | PRN
  Administered 2018-01-11: 80 mg via INTRAVENOUS

## 2018-01-11 MED ORDER — SUCCINYLCHOLINE CHLORIDE 20 MG/ML IJ SOLN
INTRAMUSCULAR | Status: DC | PRN
  Administered 2018-01-11: 100 mg via INTRAVENOUS

## 2018-01-11 MED ORDER — KETOROLAC TROMETHAMINE 30 MG/ML IJ SOLN
INTRAMUSCULAR | Status: DC | PRN
  Administered 2018-01-11: 15 mg via INTRAVENOUS

## 2018-01-11 MED ORDER — DEXAMETHASONE SODIUM PHOSPHATE 10 MG/ML IJ SOLN
INTRAMUSCULAR | Status: DC | PRN
  Administered 2018-01-11: 5 mg via INTRAVENOUS

## 2018-01-11 MED ORDER — FENTANYL CITRATE (PF) 100 MCG/2ML IJ SOLN
INTRAMUSCULAR | Status: DC | PRN
  Administered 2018-01-11 (×2): 50 ug via INTRAVENOUS
  Administered 2018-01-11 (×2): 25 ug via INTRAVENOUS
  Administered 2018-01-11: 50 ug via INTRAVENOUS

## 2018-01-11 MED ORDER — LACTATED RINGERS IV SOLN
INTRAVENOUS | Status: DC | PRN
  Administered 2018-01-11: 08:00:00 via INTRAVENOUS

## 2018-01-11 MED ORDER — HYDROCODONE-ACETAMINOPHEN 5-325 MG PO TABS
1.0000 | ORAL_TABLET | ORAL | Status: DC | PRN
  Administered 2018-01-11 – 2018-01-13 (×3): 1 via ORAL
  Filled 2018-01-11 (×3): qty 1

## 2018-01-11 MED ORDER — BUPIVACAINE-EPINEPHRINE (PF) 0.5% -1:200000 IJ SOLN
INTRAMUSCULAR | Status: AC
  Filled 2018-01-11: qty 30

## 2018-01-11 MED ORDER — ENOXAPARIN SODIUM 30 MG/0.3ML ~~LOC~~ SOLN
30.0000 mg | SUBCUTANEOUS | Status: DC
  Administered 2018-01-12 – 2018-01-15 (×4): 30 mg via SUBCUTANEOUS
  Filled 2018-01-11 (×4): qty 0.3

## 2018-01-11 MED ORDER — OXYCODONE HCL 5 MG/5ML PO SOLN: 5.0000 mg | Freq: Once | ORAL | Status: DC | PRN

## 2018-01-11 MED ORDER — PROMETHAZINE HCL 25 MG/ML IJ SOLN: 6.2500 mg | Freq: Four times a day (QID) | INTRAMUSCULAR | Status: DC | PRN

## 2018-01-11 MED ORDER — MORPHINE SULFATE (PF) 2 MG/ML IV SOLN: 2.0000 mg | INTRAVENOUS | Status: DC | PRN

## 2018-01-11 MED ORDER — FENTANYL CITRATE (PF) 100 MCG/2ML IJ SOLN
INTRAMUSCULAR | Status: AC
  Filled 2018-01-11: qty 2

## 2018-01-11 MED ORDER — MIDAZOLAM HCL 2 MG/2ML IJ SOLN
INTRAMUSCULAR | Status: DC | PRN
  Administered 2018-01-11: 2 mg via INTRAVENOUS

## 2018-01-11 MED ORDER — ACETAMINOPHEN 10 MG/ML IV SOLN
INTRAVENOUS | Status: DC | PRN
  Administered 2018-01-11: 1000 mg via INTRAVENOUS

## 2018-01-11 MED ORDER — SUGAMMADEX SODIUM 200 MG/2ML IV SOLN
INTRAVENOUS | Status: DC | PRN
  Administered 2018-01-11: 200 mg via INTRAVENOUS

## 2018-01-11 MED ORDER — ACETAMINOPHEN 325 MG PO TABS
650.0000 mg | ORAL_TABLET | ORAL | Status: DC | PRN
  Administered 2018-01-12 – 2018-01-15 (×6): 650 mg via ORAL
  Filled 2018-01-11 (×7): qty 2

## 2018-01-11 MED ORDER — ONDANSETRON HCL 4 MG PO TABS: 4.0000 mg | ORAL_TABLET | ORAL | Status: DC | PRN

## 2018-01-11 MED ORDER — BUPIVACAINE-EPINEPHRINE 0.5% -1:200000 IJ SOLN
INTRAMUSCULAR | Status: DC | PRN
  Administered 2018-01-11: 30 mL

## 2018-01-11 MED ORDER — LACTATED RINGERS IV SOLN
INTRAVENOUS | Status: DC
  Administered 2018-01-11 – 2018-01-12 (×2): via INTRAVENOUS

## 2018-01-11 MED ORDER — ROCURONIUM BROMIDE 100 MG/10ML IV SOLN
INTRAVENOUS | Status: DC | PRN
  Administered 2018-01-11: 5 mg via INTRAVENOUS
  Administered 2018-01-11: 20 mg via INTRAVENOUS

## 2018-01-11 MED ORDER — LIDOCAINE HCL (CARDIAC) PF 100 MG/5ML IV SOSY
PREFILLED_SYRINGE | INTRAVENOUS | Status: DC | PRN
  Administered 2018-01-11: 60 mg via INTRAVENOUS

## 2018-01-11 MED ORDER — SODIUM CHLORIDE 0.9 % IV SOLN
1.0000 g | INTRAVENOUS | Status: DC
  Administered 2018-01-12: 1000 mg via INTRAVENOUS
  Filled 2018-01-11 (×2): qty 1

## 2018-01-11 MED ORDER — ONDANSETRON HCL 4 MG/2ML IJ SOLN
INTRAMUSCULAR | Status: DC | PRN
  Administered 2018-01-11: 4 mg via INTRAVENOUS

## 2018-01-11 MED ORDER — FENTANYL CITRATE (PF) 100 MCG/2ML IJ SOLN
25.0000 ug | INTRAMUSCULAR | Status: DC | PRN
  Administered 2018-01-11 (×2): 50 ug via INTRAVENOUS

## 2018-01-11 MED ORDER — OXYCODONE HCL 5 MG PO TABS: 5.0000 mg | ORAL_TABLET | Freq: Once | ORAL | Status: DC | PRN

## 2018-01-11 MED ORDER — PHENYLEPHRINE HCL 10 MG/ML IJ SOLN
INTRAMUSCULAR | Status: DC | PRN
  Administered 2018-01-11 (×3): 150 ug via INTRAVENOUS
  Administered 2018-01-11 (×2): 100 ug via INTRAVENOUS

## 2018-01-11 MED ORDER — HYDROMORPHONE HCL 1 MG/ML IJ SOLN
INTRAMUSCULAR | Status: DC | PRN
  Administered 2018-01-11: .5 mg via INTRAVENOUS

## 2018-01-11 MED ORDER — PANTOPRAZOLE SODIUM 40 MG PO TBEC
40.0000 mg | DELAYED_RELEASE_TABLET | Freq: Every day | ORAL | Status: DC
  Administered 2018-01-12 – 2018-01-15 (×4): 40 mg via ORAL
  Filled 2018-01-11 (×4): qty 1

## 2018-01-11 SURGICAL SUPPLY — 46 items
BULB RESERV EVAC DRAIN JP 100C (MISCELLANEOUS) ×3 IMPLANT
CANISTER SUCT 1200ML W/VALVE (MISCELLANEOUS) IMPLANT
CANISTER SUCT 3000ML PPV (MISCELLANEOUS) ×3 IMPLANT
CATH ROBINSON RED A/P 18FR (CATHETERS) ×3 IMPLANT
CHLORAPREP W/TINT 26ML (MISCELLANEOUS) ×3 IMPLANT
CLOSURE WOUND 1/2 X4 (GAUZE/BANDAGES/DRESSINGS)
COVER CLAMP SIL LG PBX B (MISCELLANEOUS) ×3 IMPLANT
DRAIN CHANNEL JP 15F RND 16 (MISCELLANEOUS) ×3 IMPLANT
DRAPE INCISE IOBAN 66X45 STRL (DRAPES) ×3 IMPLANT
DRAPE LAPAROTOMY 100X77 ABD (DRAPES) ×3 IMPLANT
DRSG OPSITE POSTOP 4X8 (GAUZE/BANDAGES/DRESSINGS) ×3 IMPLANT
DRSG TELFA 3X8 NADH (GAUZE/BANDAGES/DRESSINGS) ×3 IMPLANT
DRSG TELFA 4X3 1S NADH ST (GAUZE/BANDAGES/DRESSINGS) ×3 IMPLANT
ELECT BLADE 6 FLAT ULTRCLN (ELECTRODE) ×3 IMPLANT
ELECT REM PT RETURN 9FT ADLT (ELECTROSURGICAL) ×3
ELECTRODE REM PT RTRN 9FT ADLT (ELECTROSURGICAL) ×1 IMPLANT
GAUZE SPONGE 4X4 12PLY STRL (GAUZE/BANDAGES/DRESSINGS) ×3 IMPLANT
GLOVE BIO SURGEON STRL SZ7.5 (GLOVE) ×15 IMPLANT
GLOVE INDICATOR 8.0 STRL GRN (GLOVE) ×3 IMPLANT
GOWN STRL REUS W/ TWL LRG LVL3 (GOWN DISPOSABLE) ×2 IMPLANT
GOWN STRL REUS W/TWL LRG LVL3 (GOWN DISPOSABLE) ×4
HOLDER FOLEY CATH W/STRAP (MISCELLANEOUS) ×3 IMPLANT
KIT TURNOVER KIT A (KITS) ×3 IMPLANT
LABEL OR SOLS (LABEL) IMPLANT
NEEDLE HYPO 22GX1.5 SAFETY (NEEDLE) ×3 IMPLANT
NEEDLE HYPO 25X1 1.5 SAFETY (NEEDLE) ×3 IMPLANT
NS IRRIG 1000ML POUR BTL (IV SOLUTION) ×3 IMPLANT
PACK BASIN MAJOR ARMC (MISCELLANEOUS) ×3 IMPLANT
PACK BASIN MINOR ARMC (MISCELLANEOUS) ×3 IMPLANT
RETRACTOR WOUND ALXS 18CM MED (MISCELLANEOUS) ×1 IMPLANT
RTRCTR WOUND ALEXIS O 18CM MED (MISCELLANEOUS) ×3
SET YANKAUER POOLE SUCT (MISCELLANEOUS) ×3 IMPLANT
SPONGE KITTNER 5P (MISCELLANEOUS) ×6 IMPLANT
SPONGE LAP 18X18 RF (DISPOSABLE) ×3 IMPLANT
STAPLER SKIN PROX 35W (STAPLE) ×3 IMPLANT
STRIP CLOSURE SKIN 1/2X4 (GAUZE/BANDAGES/DRESSINGS) IMPLANT
SUT ETH BLK MONO 3 0 FS 1 12/B (SUTURE) ×3 IMPLANT
SUT PROLENE 0 CT 1 30 (SUTURE) ×12 IMPLANT
SUT SILK 3-0 (SUTURE) ×3 IMPLANT
SUT VIC AB 2-0 BRD 54 (SUTURE) ×3 IMPLANT
SUT VIC AB 3-0 SH 27 (SUTURE) ×4
SUT VIC AB 3-0 SH 27X BRD (SUTURE) ×2 IMPLANT
SUT VICRYL 3-0 SH-1 18IN (SUTURE) ×3 IMPLANT
SWABSTK COMLB BENZOIN TINCTURE (MISCELLANEOUS) IMPLANT
SYR CONTROL 10ML (SYRINGE) ×3 IMPLANT
TRAY FOLEY MTR SLVR 16FR STAT (SET/KITS/TRAYS/PACK) ×3 IMPLANT

## 2018-01-11 NOTE — Transfer of Care (Signed)
Immediate Anesthesia Transfer of Care Note  Patient: [name]  Procedure(s) Performed: EXPLORATORY LAPAROTOMY (N/A ) INCISION AND DRAINAGE PELVIC  ABSCESS (N/A )  Patient Location: PACU  Anesthesia Type:General  Level of Consciousness: drowsy, patient cooperative and responds to stimulation  Airway & Oxygen Therapy: Patient Spontanous Breathing and Patient connected to nasal cannula oxygen  Post-op Assessment: Report given to RN and Post -op Vital signs reviewed and stable  Post vital signs: Reviewed and stable  Last Vitals:  Vitals Value Taken Time  BP 113/59 01/11/2018 10:05 AM  Temp 37.5 C 01/11/2018 10:05 AM  Pulse 88 01/11/2018 10:09 AM  Resp 20 01/11/2018 10:09 AM  SpO2 98 % 01/11/2018 10:09 AM  Vitals shown include unvalidated device data.  Last Pain:  Vitals:   01/11/18 1005  TempSrc:   PainSc: Asleep         Complications: No apparent anesthesia complications

## 2018-01-11 NOTE — Anesthesia Postprocedure Evaluation (Signed)
Anesthesia Post Note  Patient: [name]  Procedure(s) Performed: EXPLORATORY LAPAROTOMY (N/A ) INCISION AND DRAINAGE PELVIC  ABSCESS (N/A )  Patient location during evaluation: PACU Anesthesia Type: General Level of consciousness: awake and alert Pain management: pain level controlled Vital Signs Assessment: post-procedure vital signs reviewed and stable Respiratory status: spontaneous breathing, nonlabored ventilation, respiratory function stable and patient connected to nasal cannula oxygen Cardiovascular status: blood pressure returned to baseline and stable Postop Assessment: no apparent nausea or vomiting Anesthetic complications: no     Last Vitals:  Vitals:   01/11/18 1056 01/11/18 1108  BP:  111/65  Pulse: 79 82  Resp: 18 17  Temp:  36.7 C  SpO2: 94% 97%    Last Pain:  Vitals:   01/11/18 1108  TempSrc: Oral  PainSc:                  Cleda MccreedyJoseph K Amylee Lodato

## 2018-01-11 NOTE — Anesthesia Post-op Follow-up Note (Signed)
Anesthesia QCDR form completed.        

## 2018-01-11 NOTE — Anesthesia Procedure Notes (Signed)
Procedure Name: Intubation Date/Time: 01/11/2018 8:12 AM Performed by: Estanislado EmmsShort, Annalis Kaczmarczyk L, CRNA Pre-anesthesia Checklist: Patient identified, Patient being monitored, Timeout performed, Emergency Drugs available and Suction available Patient Re-evaluated:Patient Re-evaluated prior to induction Oxygen Delivery Method: Circle system utilized Preoxygenation: Pre-oxygenation with 100% oxygen Induction Type: IV induction Ventilation: Mask ventilation without difficulty Laryngoscope Size: Miller and 2 Grade View: Grade I Tube type: Oral Tube size: 7.0 mm Number of attempts: 1 Airway Equipment and Method: Stylet Placement Confirmation: ETT inserted through vocal cords under direct vision,  positive ETCO2 and breath sounds checked- equal and bilateral Secured at: 21 cm Tube secured with: Tape Dental Injury: Teeth and Oropharynx as per pre-operative assessment

## 2018-01-11 NOTE — Op Note (Signed)
Preoperative diagnosis: Pelvic abscess.  Postoperative diagnosis: Same.  Operative procedure: Exploratory laparotomy, drainage of pelvic abscess, drain placement.  Operating Surgeon: Donnalee CurryJeffrey Ceilidh Torregrossa, MD.  Anesthesia: General endotracheal, Marcaine 0.5% with 1 to 2000 units of epinephrine, 30 cc.  Estimated blood loss: 100 cc.  Clinical note: This 57 year old woman is now 5-6 weeks status post sigmoid colectomy for severe diverticulitis.  Postoperative course was complicated by pelvic abscess previously drained by interventional radiology.  She has a persistent abscess and is brought to the operating for operative drainage.  Operative note: With the patient under adequate general endotracheal anesthesia the abdomen was cleansed with ChloraPrep followed by an Ioban II draped.  The colostomy appliance had been removed and a gauze placed over the stoma.  A lower midline incision was made beginning at this at the lower edge of her previous laparotomy incision.  Skin was incised sharply and remaining dissection completed with electrocautery.  The abdomen was carefully entered.  Extensive adhesions of the small bowel to the anterior abdominal wall and pelvis were noted.  A medium Alexis wound protector was placed.  Using sharp and blunt dissection the adhesions were freed to the abscess cavity could be exposed.  This encompassed the first hour of the procedure.  The abscess was bilobed and both cavities were open.  The previously placed drain was identified.  The cavity with had loculations broken up and the thick material evacuated.  It was then irrigated copiously with saline.  A 15-gauge Blake drain was placed into the cavity and anchored to the skin at its exit site with a 3-0 nylon suture.  A 18-gauge red rubber catheter was also placed in the cavity and anchored in a similar fashion to allow for irrigation of the cavity.  The abdomen was irrigated with saline.  The posterior sheath and peritoneum closed  with a running 0 Vicryl.  Fascia was closed with interrupted 0 Maxon figure-of-eight sutures.  Final irrigation was undertaken and the adipose tissue approximated with a running 2-0 Vicryl suture.  Skin was loosely approximated with staples.  Honeycomb dressing applied.  Drains were placed to suction JP drain was placed to suction and the red rubber drain capped.  New colostomy appliance placed in the patient taken to recovery room in stable condition.

## 2018-01-11 NOTE — Anesthesia Preprocedure Evaluation (Signed)
Anesthesia Evaluation  Patient identified by MRN, date of birth, ID band Patient awake    Reviewed: Allergy & Precautions, H&P , NPO status , Patient's Chart, lab work & pertinent test results  History of Anesthesia Complications Negative for: history of anesthetic complications  Airway Mallampati: III  TM Distance: <3 FB Neck ROM: full    Dental  (+) Chipped   Pulmonary neg shortness of breath, former smoker,           Cardiovascular Exercise Tolerance: Good (-) angina(-) Past MI and (-) DOE + Valvular Problems/Murmurs      Neuro/Psych  Headaches, negative psych ROS   GI/Hepatic Neg liver ROS, GERD  Medicated and Controlled,  Endo/Other  negative endocrine ROSHypothyroidism   Renal/GU      Musculoskeletal   Abdominal   Peds  Hematology negative hematology ROS (+)   Anesthesia Other Findings Past Medical History: No date: Diverticulosis No date: GERD (gastroesophageal reflux disease) No date: Headache     Comment:  H/O MIGRAINES No date: Heart murmur     Comment:  ASYMPTOMATIC No date: Hypothyroidism No date: Osteoporosis     Comment:  borderline No date: Thyroid disease     Comment:  hypothyriod  Past Surgical History: No date: BREAST SURGERY     Comment:  sterotatic biopsy on right breast. 05/25/2010: COLONOSCOPY 12/25/2016: COLONOSCOPY WITH PROPOFOL; N/A     Comment:  Flexible sigmoidoscopy, post procedure barium enema               showing narrowing without mucosal abnormality in the               sigmoid. 12/02/2017: LAPAROSCOPIC SIGMOID COLECTOMY; N/A     Comment:  Procedure: LAPAROSCOPIC ASSISTED COLECTOMY; LOOP               ILEOSTOMY;  Surgeon: Earline MayotteByrnett, Jeffrey W, MD;  Location:               [facility] ORS;  Service: General;  Laterality: N/A;  BMI    Body Mass Index:  20.51 kg/m      Reproductive/Obstetrics negative OB ROS                             Anesthesia  Physical Anesthesia Plan  ASA: III  Anesthesia Plan: General ETT   Post-op Pain Management:    Induction: Intravenous  PONV Risk Score and Plan: Ondansetron, Dexamethasone, Midazolam and Treatment may vary due to age or medical condition  Airway Management Planned: Oral ETT  Additional Equipment:   Intra-op Plan:   Post-operative Plan: Extubation in OR  Informed Consent: I have reviewed the patients History and Physical, chart, labs and discussed the procedure including the risks, benefits and alternatives for the proposed anesthesia with the patient or authorized representative who has indicated his/her understanding and acceptance.   Dental Advisory Given  Plan Discussed with: Anesthesiologist, CRNA and Surgeon  Anesthesia Plan Comments: (Patient consented for risks of anesthesia including but not limited to:  - adverse reactions to medications - damage to teeth, lips or other oral mucosa - sore throat or hoarseness - Damage to heart, brain, lungs or loss of life  Patient voiced understanding.)        Anesthesia Quick Evaluation

## 2018-01-11 NOTE — Progress Notes (Signed)
Febrile overnight, no pain.  Lungs clear.  Cardio: Regular rhythm.  Abdomen: Soft.  Extremities: No calf tenderness or swelling.  CBC: Baseline.  Modest elevation of platelet count.  Reviewed plans for today surgery: Exploration, abscess drainage, control of anastomotic leak.

## 2018-01-12 ENCOUNTER — Encounter: Payer: Self-pay | Admitting: General Surgery

## 2018-01-12 MED ORDER — SODIUM CHLORIDE 0.9 % IV SOLN
1.0000 g | INTRAVENOUS | Status: DC
  Administered 2018-01-13 – 2018-01-15 (×3): 1000 mg via INTRAVENOUS
  Filled 2018-01-12 (×4): qty 1

## 2018-01-12 MED ORDER — KCL IN DEXTROSE-NACL 40-5-0.45 MEQ/L-%-% IV SOLN
INTRAVENOUS | Status: DC
  Administered 2018-01-12 – 2018-01-13 (×2): via INTRAVENOUS
  Filled 2018-01-12 (×3): qty 1000

## 2018-01-12 NOTE — Progress Notes (Signed)
AVSS. No nausea. Reports more "stinging" pain than with initial surgery. Likely can [place] to dissection of the adhesions off the anterior abdominal wall.  Up ambulating.  Lungs: Clear  Cardio: Regular rhythm.  Her graph abdomen: Soft, normal bowel sounds.  Dressings: Dry.  Wound Gram stain: Gram-positive rods, gram-negative rods, few gram-positive cocci.  Culture pending.  Red rubber irrigated with 50 cc saline without incident.  Immediate return via [place].  Doing well.  Will advance diet.

## 2018-01-13 LAB — T3, FREE: T3 FREE: 1.7 pg/mL — AB (ref 2.0–4.4)

## 2018-01-13 MED ORDER — POTASSIUM CHLORIDE CRYS ER 10 MEQ PO TBCR
10.0000 meq | EXTENDED_RELEASE_TABLET | Freq: Two times a day (BID) | ORAL | Status: DC
  Administered 2018-01-13 – 2018-01-15 (×5): 10 meq via ORAL
  Filled 2018-01-13 (×5): qty 1

## 2018-01-13 NOTE — Care Management (Signed)
Patient status post Exploratory laparotomy, drainage of pelvic abscess, drain placement.  Patient lives at home with husband.  Open with [facility].  Barbara CowerJason with [facility] notified of admission.

## 2018-01-13 NOTE — Progress Notes (Signed)
AVSS. Feeling well. Burning along wound markedly improved. Reported some mild difficulty knowing when she needs to void, better today. ABD: Soft.  Drainage: Volumes modest, clear. No culture results yet.

## 2018-01-14 ENCOUNTER — Inpatient Hospital Stay: Payer: Self-pay

## 2018-01-14 MED ORDER — THYROID 60 MG PO TABS
120.0000 mg | ORAL_TABLET | Freq: Every day | ORAL | Status: DC
  Administered 2018-01-14 – 2018-01-15 (×2): 120 mg via ORAL
  Filled 2018-01-14 (×2): qty 2

## 2018-01-14 MED ORDER — SODIUM CHLORIDE 0.9% FLUSH
10.0000 mL | Freq: Two times a day (BID) | INTRAVENOUS | Status: DC
  Administered 2018-01-14: 20 mL
  Administered 2018-01-15: 10 mL

## 2018-01-14 MED ORDER — SODIUM CHLORIDE 0.9% FLUSH: 10.0000 mL | INTRAVENOUS | Status: DC | PRN

## 2018-01-14 NOTE — Progress Notes (Signed)
Peripherally Inserted Central Catheter/Midline Placement  The IV Nurse has discussed with the patient and/or persons authorized to consent for the patient, the purpose of this procedure and the potential benefits and risks involved with this procedure.  The benefits include less needle sticks, lab draws from the catheter, and the patient may be discharged home with the catheter. Risks include, but not limited to, infection, bleeding, blood clot (thrombus formation), and puncture of an artery; nerve damage and irregular heartbeat and possibility to perform a PICC exchange if needed/ordered by physician.  Alternatives to this procedure were also discussed.  Bard Power PICC patient education guide, fact sheet on infection prevention and patient information card has been provided to patient /or left at bedside.    PICC/Midline Placement Documentation  PICC Single Lumen 01/14/18 PICC Right Basilic 38 cm 1 cm (Active)  Indication for Insertion or Continuance of Line Home intravenous therapies (PICC only) 01/14/2018  8:21 PM  Exposed Catheter (cm) 0 cm 01/14/2018  8:21 PM  Site Assessment Dry;Clean;Intact 01/14/2018  8:21 PM  Line Status Flushed;Saline locked;Blood return noted 01/14/2018  8:21 PM  Dressing Type Transparent 01/14/2018  8:21 PM  Dressing Status Clean;Dry;Intact;Antimicrobial disc in place 01/14/2018  8:21 PM  Dressing Change Due 01/21/18 01/14/2018  8:21 PM       [name], [name] 01/14/2018, 8:22 PM

## 2018-01-14 NOTE — Progress Notes (Signed)
AVSS. Tolerating diet well. No discomfort with cavity irrigations. No rectal drainage. Lungs: Clear. Cardio: RR. ABD: Soft, non-tender. Wound: Clean under honeycomb dressing. Drain sites clean. Irrigated and return fluid is odor free. Extrem: Soft. Day four of Invanz. Thyroid functions reviewed, mild elevation of TSH, low T3. Will restart Armour Thyroid at 120 rather than 90 for the short term.  Culture results still not available (day 4).  Plan: Home IV antibiotics after culture results available.

## 2018-01-15 ENCOUNTER — Other Ambulatory Visit: Payer: BC Managed Care – PPO

## 2018-01-15 ENCOUNTER — Other Ambulatory Visit: Payer: Self-pay | Admitting: General Surgery

## 2018-01-15 DIAGNOSIS — N739 Female pelvic inflammatory disease, unspecified: Secondary | ICD-10-CM

## 2018-01-15 LAB — CBC WITH DIFFERENTIAL/PLATELET
Band Neutrophils: 1 %
Basophils Absolute: 0.1 10*3/uL (ref 0–0.1)
Basophils Relative: 1 %
Blasts: 0 %
Eosinophils Absolute: 0.2 10*3/uL (ref 0–0.7)
Eosinophils Relative: 3 %
HEMATOCRIT: 25.8 % — AB (ref 35.0–47.0)
Hemoglobin: 8.7 g/dL — ABNORMAL LOW (ref 12.0–16.0)
Lymphocytes Relative: 38 %
Lymphs Abs: 2.5 10*3/uL (ref 1.0–3.6)
MCH: 28.5 pg (ref 26.0–34.0)
MCHC: 33.7 g/dL (ref 32.0–36.0)
MCV: 84.5 fL (ref 80.0–100.0)
MONOS PCT: 4 %
Metamyelocytes Relative: 2 %
Monocytes Absolute: 0.3 10*3/uL (ref 0.2–0.9)
Myelocytes: 2 %
NEUTROS PCT: 49 %
NRBC: 0 /100{WBCs}
Neutro Abs: 3.5 10*3/uL (ref 1.4–6.5)
Other: 0 %
Platelets: 469 10*3/uL — ABNORMAL HIGH (ref 150–440)
Promyelocytes Relative: 0 %
RBC: 3.05 MIL/uL — ABNORMAL LOW (ref 3.80–5.20)
RDW: 17.3 % — AB (ref 11.5–14.5)
WBC: 6.6 10*3/uL (ref 3.6–11.0)

## 2018-01-15 LAB — AEROBIC/ANAEROBIC CULTURE W GRAM STAIN (SURGICAL/DEEP WOUND)

## 2018-01-15 LAB — BASIC METABOLIC PANEL
Anion gap: 4 — ABNORMAL LOW (ref 5–15)
BUN: 5 mg/dL — ABNORMAL LOW (ref 6–20)
CO2: 27 mmol/L (ref 22–32)
Calcium: 7.5 mg/dL — ABNORMAL LOW (ref 8.9–10.3)
Chloride: 106 mmol/L (ref 98–111)
Creatinine, Ser: 0.53 mg/dL (ref 0.44–1.00)
GFR calc Af Amer: >60 mL/min (ref >60)
GFR calc non Af Amer: >60 mL/min (ref >60)
Glucose, Bld: 92 mg/dL (ref 70–99)
Potassium: 3.9 mmol/L (ref 3.5–5.1)
Sodium: 138 mmol/L (ref 135–145)

## 2018-01-15 LAB — AEROBIC/ANAEROBIC CULTURE (SURGICAL/DEEP WOUND)

## 2018-01-15 MED ORDER — AMPICILLIN 500 MG PO CAPS: 500.0000 mg | ORAL_CAPSULE | Freq: Three times a day (TID) | ORAL | 0 refills | Status: DC

## 2018-01-15 MED ORDER — ERTAPENEM IV (FOR PTA / DISCHARGE USE ONLY): 1.0000 g | INTRAVENOUS | 0 refills | Status: DC

## 2018-01-15 MED ORDER — THYROID 30 MG PO TABS: 30.0000 mg | ORAL_TABLET | Freq: Every day | ORAL | 3 refills | Status: DC

## 2018-01-15 MED ORDER — SODIUM CHLORIDE 0.9 % IV SOLN: 1.0000 g | INTRAVENOUS | 0 refills | Status: DC

## 2018-01-15 NOTE — Progress Notes (Signed)
AVSS. Labs reviewed/ Hypokalemia resolved. WBC remains normal. HGB 8.7 (baseline prior to admission). Lungs: Clear. Cardio: RR. ABD: Soft. Wound: Clean and dry. Extrem: Soft. Blake drain: Slightly turbid fluid, mild odor. Red Rubber irrigation catheter removed. Right gluteal drain site healed. Culture: enterococcus only organism for which cultures are avaiable. Will send home on IV Invanz and po Ampicillin. F/U labs one week.

## 2018-01-15 NOTE — Care Management Note (Signed)
Case Management Note  Patient Details  Name: [name] MRN: [id number] Date of Birth: 02/10/1961   Patient to discharge home today with IV antibiotics.  Patient has already received her dose today. And start of care for home health will be tomorrow morning with [facility].  Originally the patient states that her copay per visit with a/hc was $33, and wanted to check and other agencies to see if there would be a copay.  After discussion with her spouse she has decided to continue with services through [facility].  Barbara CowerJason with [facility] aware of discharge.  RNCM signing off.   Subjective/Objective:                    Action/Plan:   Expected Discharge Date:  01/15/18               Expected Discharge Plan:  Home w Home Health Services  In-House Referral:     Discharge planning Services  CM Consult  Post Acute Care Choice:  Home Health, Resumption of Svcs/PTA Provider Choice offered to:  Patient  DME Arranged:    DME Agency:     HH Arranged:  RN HH Agency:  [facility]  Status of Service:  Completed, signed off  If discussed at Long Length of Stay Meetings, dates discussed:    Additional Comments:  Chapman FitchBOWEN, Sukaina Toothaker T, RN 01/15/2018, 4:28 PM

## 2018-01-23 ENCOUNTER — Ambulatory Visit (INDEPENDENT_AMBULATORY_CARE_PROVIDER_SITE_OTHER): Payer: BC Managed Care – PPO | Admitting: General Surgery

## 2018-01-23 ENCOUNTER — Encounter: Payer: Self-pay | Admitting: General Surgery

## 2018-01-23 VITALS — BP 118/64 | HR 74 | Temp 98.9°F | Resp 12 | Ht 66.0 in | Wt 125.0 lb

## 2018-01-23 DIAGNOSIS — N739 Female pelvic inflammatory disease, unspecified: Secondary | ICD-10-CM

## 2018-01-23 MED ORDER — POTASSIUM CHLORIDE CRYS ER 20 MEQ PO TBCR: 20.0000 meq | EXTENDED_RELEASE_TABLET | Freq: Two times a day (BID) | ORAL | 0 refills | Status: DC

## 2018-01-23 NOTE — Progress Notes (Signed)
Patient ID: [name], female   DOB: 1960/06/23, 57 y.o.   MRN: [id number]  Chief Complaint  Patient presents with  . Routine Post Op    HPI [name] is a 57 y.o. female here today for her post op incision and drainage pelvic abscess done on 01/11/2018. Patient states she is doing much better. Small amount liquid from rectum.  HPI  Past Medical History:  Diagnosis Date  . Diverticulosis   . GERD (gastroesophageal reflux disease)   . Headache    H/O MIGRAINES  . Heart murmur    ASYMPTOMATIC  . Hypothyroidism   . Osteoporosis    borderline  . Thyroid disease    hypothyriod    Past Surgical History:  Procedure Laterality Date  . BREAST SURGERY     sterotatic biopsy on right breast.  . COLONOSCOPY  05/25/2010  . COLONOSCOPY WITH PROPOFOL N/A 12/25/2016   Flexible sigmoidoscopy, post procedure barium enema showing narrowing without mucosal abnormality in the sigmoid.  . INCISION AND DRAINAGE ABSCESS N/A 01/11/2018   Procedure: INCISION AND DRAINAGE PELVIC  ABSCESSdrain placement;  Surgeon: [name], MD;  Location: [facility] ORS;  Service: General;  Laterality: N/A;  . LAPAROSCOPIC SIGMOID COLECTOMY N/A 12/02/2017   Procedure: LAPAROSCOPIC ASSISTED COLECTOMY; LOOP ILEOSTOMY;  Surgeon: [name], MD;  Location: [facility] ORS;  Service: General;  Laterality: N/A;  . LAPAROTOMY N/A 01/11/2018   Procedure: EXPLORATORY LAPAROTOMY;  Surgeon: [name], MD;  Location: [facility] ORS;  Service: General;  Laterality: N/A;    Family History  Problem Relation Age of Onset  . Heart disease Father        congestive heart failure  . Hypothyroidism Sister   . Arthritis Sister        RA  . Diabetes Maternal Grandmother   . Hypothyroidism Maternal Grandmother   . Alzheimer's disease Paternal Grandmother   . Hypothyroidism Sister     Social History Social History   Tobacco Use  . Smoking status: Former Smoker    Packs/day: 0.25    Years: 15.00    Pack  years: 3.75    Types: Cigarettes    Last attempt to quit: 10/16/2017    Years since quitting: 0.2  . Smokeless tobacco: Never Used  Substance Use Topics  . Alcohol use: No  . Drug use: No    No Known Allergies  Current Outpatient Medications  Medication Sig Dispense Refill  . ampicillin (PRINCIPEN) 500 MG capsule Take 1 capsule (500 mg total) by mouth 3 (three) times daily. 30 capsule 0  . ARMOUR THYROID 90 MG tablet Take 90 mg by mouth daily before breakfast.  3  . Ascorbic Acid (VITAMIN C) 1000 MG tablet Take 1,000 mg by mouth at bedtime.    . Cholecalciferol (VITAMIN D3) 2000 units TABS Take 2,000 Units by mouth 2 (two) times daily. With lunch & at bedtime.    . clobetasol ointment (TEMOVATE) 0.05 % Apply 1 application topically as needed.     . ertapenem (INVANZ) IVPB Inject 1 g into the vein daily. 10 Units 0  . ertapenem 1,000 mg in sodium chloride 0.9 % 100 mL Inject 1,000 mg into the vein daily. 10 g 0  . ibuprofen (ADVIL,MOTRIN) 200 MG tablet Take 200 mg by mouth 3 (three) times daily as needed (for pain/headaches.).    Marland Kitchen omeprazole (PRILOSEC OTC) 20 MG tablet Take 20 mg by mouth daily as needed (for acid reflux.).     Marland Kitchen Probiotic  Product (PROBIOTIC PO) Take 1 capsule by mouth daily.    Marland Kitchen. thyroid (ARMOUR THYROID) 30 MG tablet Take 1 tablet (30 mg total) by mouth daily before breakfast. Take one 90 mg tablet and one 30 mg tablet daily. 30 tablet 3  . potassium chloride SA (K-DUR,KLOR-CON) 20 MEQ tablet Take 1 tablet (20 mEq total) by mouth 2 (two) times daily. 60 tablet 0   No current facility-administered medications for this visit.     Review of Systems Review of Systems  Constitutional: Negative.   Respiratory: Negative.   Cardiovascular: Negative.     Blood pressure 118/64, pulse 74, temperature 98.9 F (37.2 C), temperature source Oral, resp. rate 12, height 5\' 6"  (1.676 m), weight 125 lb (56.7 kg). The patient's weight is up 5 pounds from her January 07, 2018 office  exam.  Physical Exam Physical Exam  Constitutional: She is oriented to person, place, and time. She appears well-developed and well-nourished.  Cardiovascular: Normal rate and regular rhythm.  Pulmonary/Chest: Effort normal and breath sounds normal.  Abdominal:    Staples removed, steri strips applied.  Neurological: She is alert and oriented to person, place, and time.  Skin: Skin is warm and dry.  Psychiatric: Her behavior is normal.    Data Reviewed Laboratory studies completed January 22, 2018 showed a low serum potassium 3.4.  (Supplements ordered).  Albumin 2.6.  White blood cell count elevated at 12,600 with hemoglobin of 8.6, MCV of 87, 86% neutrophils, platelet count of 467,000.  Hemoglobin unchanged from January 15, 2017.  White blood cell count is elevated compared to that time.  Platelet count unchanged.  Assessment    Clinically improved, with significant weight gain and sense of well-being.  Likely controlled anastomotic leak.    Plan  Patient will complete her in the next 2-3 days as well as oral amoxicillin prescribed for the enterococcus cultured intraoperatively.  Drain functioning well. Leave PICC in place. Follow up in 12 days Labs 19th, office 20th. Patient aware of potassium RX sent in, instructions reviewed. [facility], aware of scheduled labs. The patient is aware to call back for any questions or new concerns/symptoms.    HPI, Physical Exam, Assessment and Plan have been scribed under the direction and in the presence of Donnalee CurryJeffrey Adam Sanjuan, MD.  Ples SpecterJessica Qualls, CMA  I have completed the exam and reviewed the above documentation for accuracy and completeness.  I agree with the above.  Museum/gallery conserv[employer] has been used and any errors in dictation or transcription are unintentional.  Donnalee CurryJeffrey Nekhi Liwanag, M.D., F.A.C.S.  Merrily PewJeffrey W Chun Sellen 01/24/2018, 1:52 PM

## 2018-01-23 NOTE — Patient Instructions (Addendum)
The patient is aware to call back for any questions or new concerns.  

## 2018-01-24 NOTE — Discharge Summary (Signed)
Physician Discharge Summary  Patient ID: [name] MRN: [id number] DOB/AGE: 1960-09-11 57 y.o.  Admit date: 01/10/2018 Discharge date: 01/24/2018  Admission Diagnoses:  Discharge Diagnoses:  Active Problems:   Pelvic abscess in female   Discharged Condition: good  Hospital Course: The patient was felt to have incomplete drainage of the pelvic abscess and was taken to the operating room at which time operative drainage was undertaken.  No bowel violation.  Irrigation and suction drains placed in the pelvis.  Patient tolerated procedure well.  Subsequently discharged home on IV Invanz and oral amoxicillin as the only organism culture was enterococcus.  Consults: None  Significant Diagnostic Studies: labs:    Treatments: surgery: Operative drainage.  Discharge Exam: Blood pressure (!) 110/44, pulse 80, temperature 98.2 F (36.8 C), temperature source Oral, resp. rate 18, height 5\' 4"  (1.626 m), weight 54.2 kg, SpO2 100 %. General appearance: alert and cooperative Resp: clear to auscultation bilaterally Cardio: regular rate and rhythm, S1, S2 normal, no murmur, click, rub or gallop GI: soft, non-tender; bowel sounds normal; no masses,  no organomegaly Incision/Wound: Incision healing well.  Disposition:   Discharge Instructions    Diet - low sodium heart healthy   Complete by:  As directed    Discharge instructions   Complete by:  As directed    Empty and record drainage 2-3 times/ day.  Flush with 10 cc NS once a day.  Ampicillin: 500 mg capsules: Three times a day.  Invanz (ertapenem): 1 gram by vein daily.  Bring drain record sheet to office appointments.   Increase activity slowly   Complete by:  As directed      Allergies as of 01/15/2018   No Known Allergies     Medication List    STOP taking these medications   sodium chloride 0.9 % injection     TAKE these medications   ampicillin 500 MG capsule Commonly known as:  PRINCIPEN Take 1 capsule  (500 mg total) by mouth 3 (three) times daily.   ARMOUR THYROID 90 MG tablet Generic drug:  thyroid Take 90 mg by mouth daily before breakfast. What changed:  Another medication with the same name was added. Make sure you understand how and when to take each.   thyroid 30 MG tablet Commonly known as:  ARMOUR Take 1 tablet (30 mg total) by mouth daily before breakfast. Take one 90 mg tablet and one 30 mg tablet daily. What changed:  You were already taking a medication with the same name, and this prescription was added. Make sure you understand how and when to take each.   clobetasol ointment 0.05 % Commonly known as:  TEMOVATE Apply 1 application topically as needed.   ertapenem  IVPB Commonly known as:  INVANZ Inject 1 g into the vein daily.   ertapenem 1,000 mg in sodium chloride 0.9 % 100 mL Inject 1,000 mg into the vein daily.   ibuprofen 200 MG tablet Commonly known as:  ADVIL,MOTRIN Take 200 mg by mouth 3 (three) times daily as needed (for pain/headaches.).   PRILOSEC OTC 20 MG tablet Generic drug:  omeprazole Take 20 mg by mouth daily as needed (for acid reflux.).   PROBIOTIC PO Take 1 capsule by mouth daily.   vitamin C 1000 MG tablet Take 1,000 mg by mouth at bedtime.   Vitamin D3 2000 units Tabs Take 2,000 Units by mouth 2 (two) times daily. With lunch & at bedtime.      Follow-up Information  Earline MayotteByrnett, Frayda Egley W, MD. Go on 01/23/2018.   Specialties:  General Surgery, Radiology Why:  Dr. Lemar LivingsByrnett, Thursday, August 8 at 10:15 a.m.  [phone] Contact information: [address] [place] [address] [phone]           Signed: Earline MayotteJeffrey W Beya Tipps 01/24/2018, 3:28 PM

## 2018-01-29 ENCOUNTER — Telehealth: Payer: Self-pay | Admitting: *Deleted

## 2018-01-29 NOTE — Telephone Encounter (Signed)
need return to work date

## 2018-01-31 ENCOUNTER — Telehealth: Payer: Self-pay | Admitting: *Deleted

## 2018-01-31 NOTE — Telephone Encounter (Signed)
She called to let Dr Lemar LivingsByrnett know that her temperature was 99.6 ax last evening and earlier this morning 99.2 ax, at time of call 98.9 ax. Denies chills (cold as usual) Denies nausea or vomiting, BM are normal. She did take tylenol last night. Wednesday she did some lifting and noticed the drainage has an odor and a little more creamy in color. Today, she has not noticed an odor and the color is back to what it was. Appointment is 02-04-18 Tuesday. She wanted to make sure there wasn't anything that needed to be done before next appointment. (duplicate message)

## 2018-01-31 NOTE — Telephone Encounter (Signed)
She called to let Dr Lemar LivingsByrnett know that her temperature was 99.6 ax last evening and earlier this morning 99.2 ax, at time of call 98.9 ax. Denies chills (cold as usual) Denies nausea or vomiting, BM are normal. She did take tylenol last night. Wednesday she did some lifting and noticed the drainage has an odor and a little more creamy in color. Today, she has not noticed an odor and the color is back to what it was. Appointment is 02-04-18 Tuesday. She wanted to make sure there wasn't anything that needed to be done before next appointment.

## 2018-02-04 ENCOUNTER — Ambulatory Visit (INDEPENDENT_AMBULATORY_CARE_PROVIDER_SITE_OTHER): Payer: BC Managed Care – PPO | Admitting: General Surgery

## 2018-02-04 ENCOUNTER — Encounter: Payer: Self-pay | Admitting: General Surgery

## 2018-02-04 VITALS — BP 104/60 | HR 82 | Temp 98.1°F | Resp 18 | Ht 66.0 in | Wt 126.0 lb

## 2018-02-04 DIAGNOSIS — N739 Female pelvic inflammatory disease, unspecified: Secondary | ICD-10-CM

## 2018-02-04 NOTE — Patient Instructions (Addendum)
The patient is aware to call back for any questions or new concerns.  Schedule CT scan  The patient is scheduled for a CT scan of the abdomen and pelvis with contrast at [place] on 02/11/18 at 3:00 pm. She will arrive by 2:45 pm and have nothing to eat or drink for 4 hours prior. She will pick up a prep kit for this. The patient is aware of date, time, and instructions.

## 2018-02-04 NOTE — Progress Notes (Signed)
Patient ID: [name], female   DOB: April 19, 1961, 57 y.o.   MRN: [id number]  Chief Complaint  Patient presents with  . Follow-up    HPI [name] is a 57 y.o. female here today for her post op incision and drainage pelvic abscess done on 01/11/2018. Patient states she is doing much better. No more fevers since lunch time Saturday, August 17. Drainage has slowed down since Thursday, August 15. Presently feeling well. HPI  Past Medical History:  Diagnosis Date  . Diverticulosis   . GERD (gastroesophageal reflux disease)   . Headache    H/O MIGRAINES  . Heart murmur    ASYMPTOMATIC  . Hypothyroidism   . Osteoporosis    borderline  . Thyroid disease    hypothyriod    Past Surgical History:  Procedure Laterality Date  . BREAST SURGERY     sterotatic biopsy on right breast.  . COLONOSCOPY  05/25/2010  . COLONOSCOPY WITH PROPOFOL N/A 12/25/2016   Flexible sigmoidoscopy, post procedure barium enema showing narrowing without mucosal abnormality in the sigmoid.  . INCISION AND DRAINAGE ABSCESS N/A 01/11/2018   Procedure: INCISION AND DRAINAGE PELVIC  ABSCESSdrain placement;  Surgeon: Robert Bellow, MD;  Location: [facility] ORS;  Service: General;  Laterality: N/A;  . LAPAROSCOPIC SIGMOID COLECTOMY N/A 12/02/2017   Procedure: LAPAROSCOPIC ASSISTED COLECTOMY; LOOP ILEOSTOMY;  Surgeon: Robert Bellow, MD;  Location: [facility] ORS;  Service: General;  Laterality: N/A;  . LAPAROTOMY N/A 01/11/2018   Procedure: EXPLORATORY LAPAROTOMY;  Surgeon: Robert Bellow, MD;  Location: [facility] ORS;  Service: General;  Laterality: N/A;    Family History  Problem Relation Age of Onset  . Heart disease Father        congestive heart failure  . Hypothyroidism Sister   . Arthritis Sister        RA  . Diabetes Maternal Grandmother   . Hypothyroidism Maternal Grandmother   . Alzheimer's disease Paternal Grandmother   . Hypothyroidism Sister     Social History Social History    Tobacco Use  . Smoking status: Former Smoker    Packs/day: 0.25    Years: 15.00    Pack years: 3.75    Types: Cigarettes    Last attempt to quit: 10/16/2017    Years since quitting: 0.3  . Smokeless tobacco: Never Used  Substance Use Topics  . Alcohol use: No  . Drug use: No    No Known Allergies  Current Outpatient Medications  Medication Sig Dispense Refill  . Ascorbic Acid (VITAMIN C) 1000 MG tablet Take 1,000 mg by mouth at bedtime.    . Cholecalciferol (VITAMIN D3) 2000 units TABS Take 2,000 Units by mouth 2 (two) times daily. With lunch & at bedtime.    . ertapenem (INVANZ) IVPB Inject 1 g into the vein daily. 10 Units 0  . ibuprofen (ADVIL,MOTRIN) 200 MG tablet Take 200 mg by mouth 3 (three) times daily as needed (for pain/headaches.).    Marland Kitchen omeprazole (PRILOSEC OTC) 20 MG tablet Take 20 mg by mouth daily as needed (for acid reflux.).     Marland Kitchen potassium chloride SA (K-DUR,KLOR-CON) 20 MEQ tablet Take 1 tablet (20 mEq total) by mouth 2 (two) times daily. 60 tablet 0  . Probiotic Product (PROBIOTIC PO) Take 1 capsule by mouth daily.    Marland Kitchen thyroid (ARMOUR THYROID) 30 MG tablet Take 1 tablet (30 mg total) by mouth daily before breakfast. Take one 90 mg tablet and one 30 mg tablet daily.  30 tablet 3   No current facility-administered medications for this visit.     Review of Systems Review of Systems  Constitutional: Negative.   Respiratory: Negative.   Cardiovascular: Negative.     Blood pressure 104/60, pulse 82, temperature 98.1 F (36.7 C), temperature source Skin, resp. rate 18, height '5\' 6"'$  (1.676 m), weight 126 lb (57.2 kg), SpO2 99 %.  Physical Exam Physical Exam  Constitutional: She is oriented to person, place, and time. She appears well-developed and well-nourished.  HENT:  Mouth/Throat: Oropharynx is clear and moist.  Eyes: No scleral icterus.  Neck: Neck supple.  Cardiovascular: Normal rate, regular rhythm, normal heart sounds and intact distal pulses.  No  lower leg edema  Pulmonary/Chest: Effort normal and breath sounds normal.  Abdominal: Soft. Normal appearance.    Drain and ostomy intact  Neurological: She is alert and oriented to person, place, and time.  Skin: Skin is warm and dry.  Psychiatric: Her behavior is normal.    Data Reviewed CBC and comprehensive metabolic panel became available after the patient had left the office.  Hemoglobin is up to 9.3, white blood cell normal, minimal thrombocytosis.  Albumin has improved to 3.1 from 2.6.  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    Plan   Patient will continue efforts on high-protein diet.  We will have her increase her potassium supplement to 3 times daily based on persistent low potassium 3.1.  Schedule CT abdomen and pelvis with contrast Follow up pending completion of that study.  HPI, Physical Exam, Assessment and Plan have been scribed under the direction and in the presence of Robert Bellow, MD. [name], RN  I have completed the exam and reviewed the above documentation for accuracy and completeness.  I agree with the above.  Haematologist has been used and any errors in dictation or transcription are unintentional.  Hervey Ard, M.D., F.A.C.S.  The patient is scheduled for a CT scan of the abdomen and pelvis with contrast at [place] on 02/11/18 at 3:00 pm. She will arrive by 2:45 pm and have nothing to eat or drink for 4 hours prior. She will pick up a prep kit for this. The patient is aware of date, time, and instructions.  Documented by Caryl-Lyn Otis Brace LPN  Forest Gleason Aranda Bihm 02/05/2018, 5:08 PM

## 2018-02-05 ENCOUNTER — Telehealth: Payer: Self-pay | Admitting: *Deleted

## 2018-02-05 NOTE — Telephone Encounter (Signed)
Notified patient as instructed, patient pleased. Discussed follow-up appointments, patient agrees  

## 2018-02-05 NOTE — Telephone Encounter (Signed)
-----   Message from Earline MayotteJeffrey W Byrnett, MD sent at 02/05/2018  2:54 PM EDT ----- Please let the patient know her labs are all improved. HGB is up as well as her protein levels. Potassium still a little low (Likely secondary to ileostomy losses). Have her take her KCL tablets three times a day. (Should have been taking twice a day already). Thanks.

## 2018-02-06 ENCOUNTER — Ambulatory Visit: Payer: BC Managed Care – PPO | Admitting: General Surgery

## 2018-02-10 ENCOUNTER — Telehealth: Payer: Self-pay | Admitting: General Surgery

## 2018-02-10 NOTE — Telephone Encounter (Signed)
Patient called today and wanted to leave a message for Mindi JunkerMarsha asking her to call her Home Health Care nurse ([phone]) to discuss orders for changing the  pick line bandage and medication Heparin. Patient is aware marsha isn't in the office today and didn't want to speak with anyone else.

## 2018-02-11 ENCOUNTER — Ambulatory Visit
Admission: RE | Admit: 2018-02-11 | Discharge: 2018-02-11 | Disposition: A | Discharge status: Home / Self Care | Payer: BC Managed Care – PPO | Source: Ambulatory Visit | Attending: General Surgery | Admitting: General Surgery

## 2018-02-11 DIAGNOSIS — N739 Female pelvic inflammatory disease, unspecified: Secondary | ICD-10-CM | POA: Insufficient documentation

## 2018-02-11 MED ORDER — IOPAMIDOL (ISOVUE-300) INJECTION 61%
100.0000 mL | Freq: Once | INTRAVENOUS | Status: AC | PRN
  Administered 2018-02-11: 100 mL via INTRAVENOUS

## 2018-02-11 NOTE — Telephone Encounter (Signed)
Will call tomorrow 

## 2018-02-11 NOTE — Telephone Encounter (Signed)
She states the drain is leaking at the site and they stripped the drain. She is scheduled for a CT this afternoon. Continue with daily heparin flushes of PICC and weekly dressing changes, Misty and Wendy aware.

## 2018-02-12 MED ORDER — AMOXICILLIN-POT CLAVULANATE 875-125 MG PO TABS: 1.0000 | ORAL_TABLET | Freq: Two times a day (BID) | ORAL | 0 refills | Status: DC

## 2018-02-12 NOTE — Telephone Encounter (Signed)
Notified patient as instructed, patient pleased. Discussed follow-up appointments, patient agrees She states she fell last week and has 5 sutures in her chin, [facility]

## 2018-02-12 NOTE — Telephone Encounter (Signed)
-----   Message from Earline MayotteJeffrey W Byrnett, MD sent at 02/12/2018 10:55 AM EDT ----- Call in RX for Augmentin, [phone], # [phone], one starting tonight, repeat at 7 AM tomorrow AM. Have her come in at 8 AM, going to shorten abdominal drain. Thanks.  ----- Message ----- From: Currie ParisHatch, Amariyon Maynes M, RN Sent: 02/12/2018   9:40 AM EDT To: Earline MayotteJeffrey W Byrnett, MD  CT called this morning to let you know the results were in Epic. Thanks

## 2018-02-13 ENCOUNTER — Ambulatory Visit (INDEPENDENT_AMBULATORY_CARE_PROVIDER_SITE_OTHER): Payer: BC Managed Care – PPO | Admitting: General Surgery

## 2018-02-13 ENCOUNTER — Encounter: Payer: Self-pay | Admitting: General Surgery

## 2018-02-13 VITALS — BP 130/78 | HR 118 | Temp 97.7°F | Resp 12 | Ht 66.0 in | Wt 122.0 lb

## 2018-02-13 DIAGNOSIS — N739 Female pelvic inflammatory disease, unspecified: Secondary | ICD-10-CM

## 2018-02-13 NOTE — Progress Notes (Signed)
Patient ID: [name], female   DOB: 10-Jan-1961, 57 y.o.   MRN: [id number]  Chief Complaint  Patient presents with  . Follow-up    HPI [name] is a 57 y.o. female.  Here for drain adjustment. HPI  Past Medical History:  Diagnosis Date  . Diverticulosis   . GERD (gastroesophageal reflux disease)   . Headache    H/O MIGRAINES  . Heart murmur    ASYMPTOMATIC  . Hypothyroidism   . Osteoporosis    borderline  . Thyroid disease    hypothyriod    Past Surgical History:  Procedure Laterality Date  . BREAST SURGERY     sterotatic biopsy on right breast.  . COLONOSCOPY  05/25/2010  . COLONOSCOPY WITH PROPOFOL N/A 12/25/2016   Flexible sigmoidoscopy, post procedure barium enema showing narrowing without mucosal abnormality in the sigmoid.  . INCISION AND DRAINAGE ABSCESS N/A 01/11/2018   Procedure: INCISION AND DRAINAGE PELVIC  ABSCESSdrain placement;  Surgeon: [name], MD;  Location: [facility];  Service: General;  Laterality: N/A;  . LAPAROSCOPIC SIGMOID COLECTOMY N/A 12/02/2017   Procedure: LAPAROSCOPIC ASSISTED COLECTOMY; LOOP ILEOSTOMY;  Surgeon: [name], MD;  Location: [facility];  Service: General;  Laterality: N/A;  . LAPAROTOMY N/A 01/11/2018   Procedure: EXPLORATORY LAPAROTOMY;  Surgeon: [name], MD;  Location: [facility];  Service: General;  Laterality: N/A;    Family History  Problem Relation Age of Onset  . Heart disease Father        congestive heart failure  . Hypothyroidism Sister   . Arthritis Sister        RA  . Diabetes Maternal Grandmother   . Hypothyroidism Maternal Grandmother   . Alzheimer's disease Paternal Grandmother   . Hypothyroidism Sister     Social History Social History   Tobacco Use  . Smoking status: Former Smoker    Packs/day: 0.25    Years: 15.00    Pack years: 3.75    Types: Cigarettes    Last attempt to quit: 10/16/2017    Years since quitting: 0.3  . Smokeless tobacco: Never Used   Substance Use Topics  . Alcohol use: No  . Drug use: No    No Known Allergies  Current Outpatient Medications  Medication Sig Dispense Refill  . amoxicillin-clavulanate (AUGMENTIN) 875-125 MG tablet Take 1 tablet by mouth 2 (two) times daily. Start with one tablet tonight and one tablet at 7am in the morning 10 tablet 0  . Ascorbic Acid (VITAMIN C) 1000 MG tablet Take 1,000 mg by mouth at bedtime.    . Cholecalciferol (VITAMIN D3) 2000 units TABS Take 2,000 Units by mouth 2 (two) times daily. With lunch & at bedtime.    . ertapenem (INVANZ) IVPB Inject 1 g into the vein daily. 10 Units 0  . ibuprofen (ADVIL,MOTRIN) 200 MG tablet Take 200 mg by mouth 3 (three) times daily as needed (for pain/headaches.).    Marland Kitchen omeprazole (PRILOSEC OTC) 20 MG tablet Take 20 mg by mouth daily as needed (for acid reflux.).     Marland Kitchen potassium chloride SA (K-DUR,KLOR-CON) 20 MEQ tablet Take 1 tablet (20 mEq total) by mouth 2 (two) times daily. 60 tablet 0  . Probiotic Product (PROBIOTIC PO) Take 1 capsule by mouth daily.    Marland Kitchen thyroid (ARMOUR THYROID) 30 MG tablet Take 1 tablet (30 mg total) by mouth daily before breakfast. Take one 90 mg tablet and one 30 mg tablet daily. 30 tablet 3  No current facility-administered medications for this visit.     Review of Systems Review of Systems  Constitutional: Negative.   Respiratory: Negative.   Cardiovascular: Negative.     Blood pressure 130/78, pulse (!) 118, temperature 97.7 F (36.5 C), temperature source Skin, resp. rate 12, height 5\' 6"  (1.676 m), weight 122 lb (55.3 kg), SpO2 97 %.  Physical Exam Physical Exam  Abdominal:      Data Reviewed February 11, 2018 CT in part revealed: Stomach/Bowel: Normal stomach, without wall thickening. Surgical changes in the region of the sigmoid. The previously described percutaneously placed posterior pelvic drain has been replaced with an anterior left-sided surgical drain. The cul-de-sac fluid and gas collection  is decreased in size. Measures on the order of 6.1 x 4.4 cm on image 60/2 today versus 8.0 x 7.3 cm on 01/07/2018. Decrease in trace component anterior to the uterus including on 67/2. There is apparent communication of the posterior sigmoid with the collection including on 59/2 and 89/4.   Assessment    Clinically improving.  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    Plan    The drain was looped into the area of the anastomosis and it was looped in the pelvis.  It was elected to withdraw this about 8-10 cm and resutured in place.  We will leave it on suction this week, but likely amputate the drain and leave it as a close drain next week based on the patient's clinical progress.  She will complete 5 days of oral antibiotics to minimize transient bacteremia during drain manipulation. Next week bring extra ostomy supplies and bag       HPI, Physical Exam, Assessment and Plan have been scribed under the direction and in the presence of Earline MayotteJeffrey W. Byrnett, MD. Dorathy DaftMarsha Hatch, RN  I have completed the exam and reviewed the above documentation for accuracy and completeness.  I agree with the above.  Museum/gallery conservatorDragon Technology has been used and any errors in dictation or transcription are unintentional.  Donnalee CurryJeffrey Byrnett, M.D., F.A.C.S.  Merrily PewJeffrey W Byrnett 02/13/2018, 10:39 AM

## 2018-02-13 NOTE — Patient Instructions (Addendum)
The patient is aware to call back for any questions or new concerns. Next week bring extra ostomy supplies and bag

## 2018-02-14 ENCOUNTER — Other Ambulatory Visit: Payer: Self-pay | Admitting: General Surgery

## 2018-02-19 ENCOUNTER — Ambulatory Visit (INDEPENDENT_AMBULATORY_CARE_PROVIDER_SITE_OTHER): Payer: BC Managed Care – PPO | Admitting: General Surgery

## 2018-02-19 ENCOUNTER — Encounter: Payer: Self-pay | Admitting: General Surgery

## 2018-02-19 ENCOUNTER — Telehealth: Payer: Self-pay | Admitting: General Surgery

## 2018-02-19 VITALS — BP 110/62 | HR 68 | Resp 12 | Ht 66.0 in | Wt 120.0 lb

## 2018-02-19 DIAGNOSIS — N739 Female pelvic inflammatory disease, unspecified: Secondary | ICD-10-CM

## 2018-02-19 NOTE — Telephone Encounter (Signed)
Patient called in and wanted to leave a message for [facility]. She needed to let her know the name of her Home Health care nurse (Misty/[phone])

## 2018-02-19 NOTE — Patient Instructions (Signed)
The patient is aware to call back for any questions or concerns.  

## 2018-02-19 NOTE — Progress Notes (Signed)
Patient ID: [name], female   DOB: [birth year], 57 y.o.   MRN: [id number]  Chief Complaint  Patient presents with  . Follow-up    HPI [name] is a 57 y.o. female here today for her follow up drain reposition.Patient states she is doing well at this time.    Past Medical History:  Diagnosis Date  . Diverticulosis   . GERD (gastroesophageal reflux disease)   . Headache    H/O MIGRAINES  . Heart murmur    ASYMPTOMATIC  . Hypothyroidism   . Osteoporosis    borderline  . Thyroid disease    hypothyriod    Past Surgical History:  Procedure Laterality Date  . BREAST SURGERY     sterotatic biopsy on right breast.  . COLONOSCOPY  05/25/2010  . COLONOSCOPY WITH PROPOFOL N/A 12/25/2016   Flexible sigmoidoscopy, post procedure barium enema showing narrowing without mucosal abnormality in the sigmoid.  . INCISION AND DRAINAGE ABSCESS N/A 01/11/2018   Procedure: INCISION AND DRAINAGE PELVIC  ABSCESSdrain placement;  Surgeon: Robert Bellow, MD;  Location: [facility];  Service: General;  Laterality: N/A;  . LAPAROSCOPIC SIGMOID COLECTOMY N/A 12/02/2017   Procedure: LAPAROSCOPIC ASSISTED COLECTOMY; LOOP ILEOSTOMY;  Surgeon: Robert Bellow, MD;  Location: [facility];  Service: General;  Laterality: N/A;  . LAPAROTOMY N/A 01/11/2018   Procedure: EXPLORATORY LAPAROTOMY;  Surgeon: Robert Bellow, MD;  Location: [facility];  Service: General;  Laterality: N/A;    Family History  Problem Relation Age of Onset  . Heart disease Father        congestive heart failure  . Hypothyroidism Sister   . Arthritis Sister        RA  . Diabetes Maternal Grandmother   . Hypothyroidism Maternal Grandmother   . Alzheimer's disease Paternal Grandmother   . Hypothyroidism Sister     Social History Social History   Tobacco Use  . Smoking status: Former Smoker    Packs/day: 0.25    Years: 15.00    Pack years: 3.75    Types: Cigarettes    Last attempt to quit: 10/16/2017   Years since quitting: 0.3  . Smokeless tobacco: Never Used  Substance Use Topics  . Alcohol use: No  . Drug use: No    No Known Allergies  Current Outpatient Medications  Medication Sig Dispense Refill  . Ascorbic Acid (VITAMIN C) 1000 MG tablet Take 1,000 mg by mouth at bedtime.    . Cholecalciferol (VITAMIN D3) 2000 units TABS Take 2,000 Units by mouth 2 (two) times daily. With lunch & at bedtime.    . ertapenem (INVANZ) IVPB Inject 1 g into the vein daily. 10 Units 0  . ibuprofen (ADVIL,MOTRIN) 200 MG tablet Take 200 mg by mouth 3 (three) times daily as needed (for pain/headaches.).    Marland Kitchen KLOR-CON M20 20 MEQ tablet TAKE 1 TABLET BY MOUTH TWICE A DAY 60 tablet 0  . omeprazole (PRILOSEC OTC) 20 MG tablet Take 20 mg by mouth daily as needed (for acid reflux.).     Marland Kitchen Probiotic Product (PROBIOTIC PO) Take 1 capsule by mouth daily.    Marland Kitchen thyroid (ARMOUR THYROID) 30 MG tablet Take 1 tablet (30 mg total) by mouth daily before breakfast. Take one 90 mg tablet and one 30 mg tablet daily. 30 tablet 3   No current facility-administered medications for this visit.     Review of Systems Review of Systems  Constitutional: Negative.   Respiratory: Negative.   Cardiovascular:  Negative.     Blood pressure 110/62, pulse 68, resp. rate 12, height '5\' 6"'$  (1.676 m), weight 120 lb (54.4 kg).  Physical Exam Physical Exam  Constitutional: She is oriented to person, place, and time. She appears well-developed and well-nourished.  Abdominal: There is no tenderness. No hernia.    Neurological: She is alert and oriented to person, place, and time.  Skin: Skin is warm and dry.  Psychiatric: Her behavior is normal.      Assessment    Steady progress post operative drainage of postsurgical abscess. Plan  Follow up in 2 weeks Labs next week and discontinue PICC line. CBC and Comp Met Panel RTW 03-17-18 Misty RN with [place] notified to draw labs next Tuesday complete Met panel and CBC  then may d/c PICC, she agrees. She will fax over order to be signed. Work note faxed to [employer].  We will postpone her return to work for about 2 more weeks.  She is gaining strength, and likely will go back half time.  HPI, Physical Exam, Assessment and Plan have been scribed under the direction and in the presence of Robert Bellow, MD. [name], RN  I have completed the exam and reviewed the above documentation for accuracy and completeness.  I agree with the above.  Haematologist has been used and any errors in dictation or transcription are unintentional.  Hervey Ard, M.D., F.A.C.S.   Forest Gleason [facility] 02/19/2018, 9:49 PM

## 2018-02-19 NOTE — Telephone Encounter (Signed)
Misty RN with [place] notified to draw labs next Tuesday complete Met panel and CBC then may d/c PICC, she agrees. She will fax over order to be signed. Work note faxed to [employer].

## 2018-02-20 ENCOUNTER — Ambulatory Visit: Payer: BC Managed Care – PPO | Admitting: General Surgery

## 2018-02-24 ENCOUNTER — Other Ambulatory Visit: Payer: Self-pay | Admitting: General Surgery

## 2018-02-24 NOTE — Telephone Encounter (Signed)
Prescription for KLor was received per [facility].   The patient is aware to pick up prescription.

## 2018-02-26 ENCOUNTER — Encounter: Payer: Self-pay | Admitting: General Surgery

## 2018-03-06 ENCOUNTER — Other Ambulatory Visit: Payer: Self-pay

## 2018-03-06 ENCOUNTER — Ambulatory Visit (INDEPENDENT_AMBULATORY_CARE_PROVIDER_SITE_OTHER): Payer: BC Managed Care – PPO | Admitting: General Surgery

## 2018-03-06 ENCOUNTER — Encounter: Payer: Self-pay | Admitting: General Surgery

## 2018-03-06 VITALS — BP 120/80 | HR 68 | Temp 98.1°F | Ht 66.0 in | Wt 120.0 lb

## 2018-03-06 DIAGNOSIS — N739 Female pelvic inflammatory disease, unspecified: Secondary | ICD-10-CM

## 2018-03-06 NOTE — Progress Notes (Signed)
Patient ID: [name], female   DOB: 1961-05-04, 57 y.o.   MRN: [id number]  Chief Complaint  Patient presents with  . Follow-up    2 week drain f/u    HPI [name] is a 57 y.o. female here today for her follow up drain reposition.Patient states she is doing well at this time.    The patient reports she had been having intermittent fevers up to 100 until 5 days ago.  None since.  In the last few days her appetite is beginning to improve.  She reports decreasing amounts of drainage from the JP site.  Usually daily episodes of needing to pass a small amount of stool per rectum.    No difficulty with stoma appliance.  HPI  Past Medical History:  Diagnosis Date  . Diverticulosis   . GERD (gastroesophageal reflux disease)   . Headache    H/O MIGRAINES  . Heart murmur    ASYMPTOMATIC  . Hypothyroidism   . Osteoporosis    borderline  . Thyroid disease    hypothyriod    Past Surgical History:  Procedure Laterality Date  . BREAST SURGERY     sterotatic biopsy on right breast.  . COLONOSCOPY  05/25/2010  . COLONOSCOPY WITH PROPOFOL N/A 12/25/2016   Flexible sigmoidoscopy, post procedure barium enema showing narrowing without mucosal abnormality in the sigmoid.  . INCISION AND DRAINAGE ABSCESS N/A 01/11/2018   Procedure: INCISION AND DRAINAGE PELVIC  ABSCESSdrain placement;  Surgeon: [name], MD;  Location: [facility];  Service: General;  Laterality: N/A;  . LAPAROSCOPIC SIGMOID COLECTOMY N/A 12/02/2017   Procedure: LAPAROSCOPIC ASSISTED COLECTOMY; LOOP ILEOSTOMY;  Surgeon: [name], MD;  Location: [facility];  Service: General;  Laterality: N/A;  . LAPAROTOMY N/A 01/11/2018   Procedure: EXPLORATORY LAPAROTOMY;  Surgeon: [name], MD;  Location: [facility];  Service: General;  Laterality: N/A;    Family History  Problem Relation Age of Onset  . Heart disease Father        congestive heart failure  . Hypothyroidism Sister   . Arthritis  Sister        RA  . Diabetes Maternal Grandmother   . Hypothyroidism Maternal Grandmother   . Alzheimer's disease Paternal Grandmother   . Hypothyroidism Sister     Social History Social History   Tobacco Use  . Smoking status: Former Smoker    Packs/day: 0.25    Years: 15.00    Pack years: 3.75    Types: Cigarettes    Last attempt to quit: 10/16/2017    Years since quitting: 0.3  . Smokeless tobacco: Never Used  Substance Use Topics  . Alcohol use: No  . Drug use: No    No Known Allergies  Current Outpatient Medications  Medication Sig Dispense Refill  . Ascorbic Acid (VITAMIN C) 1000 MG tablet Take 1,000 mg by mouth at bedtime.    . Cholecalciferol (VITAMIN D3) 2000 units TABS Take 2,000 Units by mouth 2 (two) times daily. With lunch & at bedtime.    . ertapenem (INVANZ) IVPB Inject 1 g into the vein daily. 10 Units 0  . ibuprofen (ADVIL,MOTRIN) 200 MG tablet Take 200 mg by mouth 3 (three) times daily as needed (for pain/headaches.).    Marland Kitchen KLOR-CON M20 20 MEQ tablet TAKE 1 TABLET BY MOUTH TWICE A DAY 180 tablet 1  . omeprazole (PRILOSEC OTC) 20 MG tablet Take 20 mg by mouth daily as needed (for acid reflux.).     Marland Kitchen  Probiotic Product (PROBIOTIC PO) Take 1 capsule by mouth daily.    Marland Kitchen. thyroid (ARMOUR THYROID) 30 MG tablet Take 1 tablet (30 mg total) by mouth daily before breakfast. Take one 90 mg tablet and one 30 mg tablet daily. 30 tablet 3   No current facility-administered medications for this visit.     Review of Systems Review of Systems  Constitutional: Negative.   Respiratory: Negative.   Cardiovascular: Negative.     Blood pressure 120/80, pulse 68, temperature 98.1 F (36.7 C), temperature source Skin, height 5\' 6"  (1.676 m), weight 120 lb (54.4 kg), head circumference 14" (35.6 cm).  Physical Exam Physical Exam  Constitutional: She is oriented to person, place, and time. She appears well-developed and well-nourished.  Abdominal:    Genitourinary:   Genitourinary Comments: Mild fullness anteriorly without bogginess to suggest a drainable abscess.  Normal sphincter tone.  No perirectal tenderness or mass-effect.  Neurological: She is oriented to person, place, and time.  Skin: Skin is warm and dry.    Data Reviewed Laboratory studies of February 26, 2018 reviewed.  Modest improvement in hematocrit to 32.  Potassium stable.  Elevated platelet count essentially unchanged.  Assessment    Anastomotic disruption with steady improvement post drain shortening.    Plan  Patient to return in two weeks The patient is aware to call back for any questions or concerns.  The patient will continue to work on protein intake.  She fatigues earlier and I do not anticipate a return to work within the next 4 weeks.  She will decrease her potassium supplements to 1 daily dose.   HPI, Physical Exam, Assessment and Plan have been scribed under the direction and in the presence of Donnalee CurryJeffrey Othello Dickenson, MD.  Ples SpecterJessica Qualls, CMA   I have completed the exam and reviewed the above documentation for accuracy and completeness.  I agree with the above.  Museum/gallery conservatorDragon Technology has been used and any errors in dictation or transcription are unintentional.  Donnalee CurryJeffrey Prateek Knipple, M.D., F.A.C.S.   Merrily PewJeffrey W Lum Stillinger 03/06/2018, 9:11 PM

## 2018-03-06 NOTE — Patient Instructions (Addendum)
  Patient to return in two weeks. The patient is aware to call back for any questions or concerns.    

## 2018-03-10 ENCOUNTER — Ambulatory Visit (INDEPENDENT_AMBULATORY_CARE_PROVIDER_SITE_OTHER): Payer: BC Managed Care – PPO | Admitting: Surgery

## 2018-03-10 ENCOUNTER — Telehealth: Payer: Self-pay | Admitting: *Deleted

## 2018-03-10 ENCOUNTER — Encounter: Payer: Self-pay | Admitting: Surgery

## 2018-03-10 VITALS — BP 127/77 | HR 80 | Temp 98.7°F | Ht 64.0 in | Wt 120.0 lb

## 2018-03-10 DIAGNOSIS — Z09 Encounter for follow-up examination after completed treatment for conditions other than malignant neoplasm: Secondary | ICD-10-CM

## 2018-03-10 NOTE — Telephone Encounter (Signed)
Patient called stated that on Saturday she noticed her sutures that is holding the drain in place has came loose and she stated her drain is just hanging there.

## 2018-03-10 NOTE — Progress Notes (Signed)
Covering for Dr. Lemar Soto. Casey FailWendy had a previous laparoscopic sigmoid colectomy and loop colostomy June 2019 and was complicated by a pelvic abscess requiring open drainage via laparotomy July 27.  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  She comes to the office today because the stitch fell off her drain and part of the drain came out.  Approximately an inch of her drain is out.  The stitch is off. Abdomen is completely soft nontender ileostomy is functioning well.  There are no peritonitis or signs of infection.  I trimmed about an inch of the drain and secured back with a 3-0 Prolene in the standard fashion.  She will follow w Dr. Lemar Soto next week No surgical intervention required

## 2018-03-10 NOTE — Telephone Encounter (Signed)
Called patient back and asked how we could help her. She stated that this Saturday she noticed that her drain had moved and that she wasn't sure if she needed to be seen today. She stated that she continues to drain but was scared that her drain could fall out. I told patient to come in at 3:00 PM to see one of our other surgeon so he could take a look at it in case it needed to be fixed in place or it could be removed. Patient agreed and had no further questions.

## 2018-03-10 NOTE — Patient Instructions (Signed)
We will see you back in a week.  Please give us a call in case you have questions.

## 2018-03-19 ENCOUNTER — Ambulatory Visit (INDEPENDENT_AMBULATORY_CARE_PROVIDER_SITE_OTHER): Payer: BC Managed Care – PPO | Admitting: General Surgery

## 2018-03-19 ENCOUNTER — Encounter: Payer: Self-pay | Admitting: General Surgery

## 2018-03-19 VITALS — BP 118/70 | HR 86 | Resp 14 | Ht 64.0 in | Wt 120.0 lb

## 2018-03-19 DIAGNOSIS — N739 Female pelvic inflammatory disease, unspecified: Secondary | ICD-10-CM

## 2018-03-19 NOTE — Patient Instructions (Signed)
Return in two weeks. The patient is aware to call back for any questions or concerns.  

## 2018-03-19 NOTE — Progress Notes (Signed)
Patient ID: [name], female   DOB: [birth year], 57 y.o.   MRN: [id number]  Chief Complaint  Patient presents with  . Follow-up    HPI [name] is a 57 y.o. female here today for her follow up diverticulitis. Patient states she is doing well at this time. Drain intact. The patient reports she has had no fever or chills for about 3 weeks.  Diet is repair there is good.  Energy level improving.  Walking daily.  Scheduled to return to work later this month.  Minimal rectal output.  Past Medical History:  Diagnosis Date  . Diverticulosis   . GERD (gastroesophageal reflux disease)   . Headache    H/O MIGRAINES  . Heart murmur    ASYMPTOMATIC  . Hypothyroidism   . Osteoporosis    borderline  . Thyroid disease    hypothyriod    Past Surgical History:  Procedure Laterality Date  . BREAST SURGERY     sterotatic biopsy on right breast.  . COLONOSCOPY  05/25/2010  . COLONOSCOPY WITH PROPOFOL N/A 12/25/2016   Flexible sigmoidoscopy, post procedure barium enema showing narrowing without mucosal abnormality in the sigmoid.  . INCISION AND DRAINAGE ABSCESS N/A 01/11/2018   Procedure: INCISION AND DRAINAGE PELVIC  ABSCESSdrain placement;  Surgeon: [name], MD;  Location: [facility];  Service: General;  Laterality: N/A;  . LAPAROSCOPIC SIGMOID COLECTOMY N/A 12/02/2017   Procedure: LAPAROSCOPIC ASSISTED COLECTOMY; LOOP ILEOSTOMY;  Surgeon: [name], MD;  Location: [facility];  Service: General;  Laterality: N/A;  . LAPAROTOMY N/A 01/11/2018   Procedure: EXPLORATORY LAPAROTOMY;  Surgeon: [name], MD;  Location: [facility];  Service: General;  Laterality: N/A;    Family History  Problem Relation Age of Onset  . Heart disease Father        congestive heart failure  . Hypothyroidism Sister   . Arthritis Sister        RA  . Diabetes Maternal Grandmother   . Hypothyroidism Maternal Grandmother   . Alzheimer's disease Paternal Grandmother   .  Hypothyroidism Sister     Social History Social History   Tobacco Use  . Smoking status: Former Smoker    Packs/day: 0.25    Years: 15.00    Pack years: 3.75    Types: Cigarettes    Last attempt to quit: 10/16/2017    Years since quitting: 0.4  . Smokeless tobacco: Never Used  Substance Use Topics  . Alcohol use: No  . Drug use: No    No Known Allergies  Current Outpatient Medications  Medication Sig Dispense Refill  . Ascorbic Acid (VITAMIN C) 1000 MG tablet Take 1,000 mg by mouth at bedtime.    . Cholecalciferol (VITAMIN D3) 2000 units TABS Take 2,000 Units by mouth 2 (two) times daily. With lunch & at bedtime.    . ertapenem (INVANZ) IVPB Inject 1 g into the vein daily. 10 Units 0  . ibuprofen (ADVIL,MOTRIN) 200 MG tablet Take 200 mg by mouth 3 (three) times daily as needed (for pain/headaches.).    Marland Kitchen KLOR-CON M20 20 MEQ tablet TAKE 1 TABLET BY MOUTH TWICE A DAY 180 tablet 1  . omeprazole (PRILOSEC OTC) 20 MG tablet Take 20 mg by mouth daily as needed (for acid reflux.).     Marland Kitchen Probiotic Product (PROBIOTIC PO) Take 1 capsule by mouth daily.    Marland Kitchen thyroid (ARMOUR THYROID) 30 MG tablet Take 1 tablet (30 mg total) by mouth daily before breakfast.  Take one 90 mg tablet and one 30 mg tablet daily. 30 tablet 3   No current facility-administered medications for this visit.     Review of Systems Review of Systems  Constitutional: Negative.   Respiratory: Negative.   Cardiovascular: Negative.     Blood pressure 118/70, pulse 86, resp. rate 14, height 5\' 4"  (1.626 m), weight 120 lb (54.4 kg).  Physical Exam Physical Exam  Constitutional: She is oriented to person, place, and time. She appears well-developed and well-nourished.  Abdominal:    Neurological: She is alert and oriented to person, place, and time.  Skin: Skin is warm and dry.  Drain removed .     Assessment    Steady progress status post drainage of pelvic abscess.    Plan  Patient is encouraged to  increase her activity to improve her endurance.  High-calorie, high-protein meals encouraged.  Weight is stable but weight gain would be good.   Patient to return in two weeks. The patient is aware to call back for any questions or concerns.  HPI, Physical Exam, Assessment and Plan have been scribed under the direction and in the presence of Donnalee Curry, MD.  Ples Specter, CMA  I have completed the exam and reviewed the above documentation for accuracy and completeness.  I agree with the above.  Museum/gallery conservator has been used and any errors in dictation or transcription are unintentional.  Donnalee Curry, M.D., F.A.C.S.  Merrily Pew Lanyia Jewel 03/20/2018, 8:13 AM

## 2018-04-01 ENCOUNTER — Encounter: Payer: Self-pay | Admitting: General Surgery

## 2018-04-01 ENCOUNTER — Ambulatory Visit (INDEPENDENT_AMBULATORY_CARE_PROVIDER_SITE_OTHER): Payer: BC Managed Care – PPO | Admitting: General Surgery

## 2018-04-01 ENCOUNTER — Telehealth: Payer: Self-pay | Admitting: *Deleted

## 2018-04-01 VITALS — BP 137/79 | HR 82 | Temp 97.7°F | Resp 18 | Ht 64.0 in | Wt 125.2 lb

## 2018-04-01 DIAGNOSIS — K5792 Diverticulitis of intestine, part unspecified, without perforation or abscess without bleeding: Secondary | ICD-10-CM

## 2018-04-01 NOTE — Patient Instructions (Addendum)
  We see you back in one month.

## 2018-04-01 NOTE — Telephone Encounter (Signed)
Patient called and stated that her returned to work note stated that she has been under Dr.Byrnetts care in July but patient stated that it was really in June and wanted to see about getting it changed

## 2018-04-01 NOTE — Progress Notes (Signed)
Patient ID: [name], female   DOB: 10/07/1960, 57 y.o.   MRN: [id number]  Chief Complaint  Patient presents with  . Follow-up    Diverticulitis     HPI [name] is a 57 y.o. female.  Here today for follow up with Diverticulitis. No problems at this time.  HPI  Past Medical History:  Diagnosis Date  . Diverticulosis   . GERD (gastroesophageal reflux disease)   . Headache    H/O MIGRAINES  . Heart murmur    ASYMPTOMATIC  . Hypothyroidism   . Osteoporosis    borderline  . Thyroid disease    hypothyriod    Past Surgical History:  Procedure Laterality Date  . BREAST SURGERY     sterotatic biopsy on right breast.  . COLONOSCOPY  05/25/2010  . COLONOSCOPY WITH PROPOFOL N/A 12/25/2016   Flexible sigmoidoscopy, post procedure barium enema showing narrowing without mucosal abnormality in the sigmoid.  . INCISION AND DRAINAGE ABSCESS N/A 01/11/2018   Procedure: INCISION AND DRAINAGE PELVIC  ABSCESSdrain placement;  Surgeon: [name], MD;  Location: [facility];  Service: General;  Laterality: N/A;  . LAPAROSCOPIC SIGMOID COLECTOMY N/A 12/02/2017   Procedure: LAPAROSCOPIC ASSISTED COLECTOMY; LOOP ILEOSTOMY;  Surgeon: [name], MD;  Location: [facility];  Service: General;  Laterality: N/A;  . LAPAROTOMY N/A 01/11/2018   Procedure: EXPLORATORY LAPAROTOMY;  Surgeon: [name], MD;  Location: [facility];  Service: General;  Laterality: N/A;    Family History  Problem Relation Age of Onset  . Heart disease Father        congestive heart failure  . Hypothyroidism Sister   . Arthritis Sister        RA  . Diabetes Maternal Grandmother   . Hypothyroidism Maternal Grandmother   . Alzheimer's disease Paternal Grandmother   . Hypothyroidism Sister     Social History Social History   Tobacco Use  . Smoking status: Former Smoker    Packs/day: 0.25    Years: 15.00    Pack years: 3.75    Types: Cigarettes    Last attempt to quit: 10/16/2017   Years since quitting: 0.4  . Smokeless tobacco: Never Used  Substance Use Topics  . Alcohol use: No  . Drug use: No    No Known Allergies  Current Outpatient Medications  Medication Sig Dispense Refill  . Ascorbic Acid (VITAMIN C) 1000 MG tablet Take 1,000 mg by mouth at bedtime.    . Cholecalciferol (VITAMIN D3) 2000 units TABS Take 2,000 Units by mouth 2 (two) times daily. With lunch & at bedtime.    Marland Kitchen ibuprofen (ADVIL,MOTRIN) 200 MG tablet Take 200 mg by mouth 3 (three) times daily as needed (for pain/headaches.).    Marland Kitchen omeprazole (PRILOSEC OTC) 20 MG tablet Take 20 mg by mouth daily as needed (for acid reflux.).     Marland Kitchen potassium chloride (KLOR-CON) 20 MEQ packet Take by mouth 2 (two) times daily.    . Probiotic Product (PROBIOTIC PO) Take 1 capsule by mouth daily.    Marland Kitchen thyroid (ARMOUR THYROID) 30 MG tablet Take 1 tablet (30 mg total) by mouth daily before breakfast. Take one 90 mg tablet and one 30 mg tablet daily. 30 tablet 3   No current facility-administered medications for this visit.     Review of Systems Review of Systems  Constitutional: Negative.   Respiratory: Negative.   Neurological: Negative.   Psychiatric/Behavioral: Negative.     Blood pressure 137/79, pulse 82,  temperature 97.7 F (36.5 C), temperature source Temporal, resp. rate 18, height 5\' 4"  (1.626 m), weight 125 lb 3.2 oz (56.8 kg), SpO2 98 %.  Weight is up five pounds from her last visit two weeks ago.   Physical Exam Physical Exam  Constitutional: She is oriented to person, place, and time. She appears well-developed and well-nourished.  Cardiovascular: Normal rate.  Pulmonary/Chest: Effort normal.  Abdominal: Normal appearance and bowel sounds are normal.  Neurological: She is alert and oriented to person, place, and time.  Skin: Skin is warm and dry.    Data Reviewed Laboratory studies dated March 10, 2018 including a comprehensive metabolic panel showed improvement in her serum albumin to  3.3.  Normal electrolytes.  Creatinine 0.7.  Serum iron was low at 22, ferritin normal at 97, T4 mid range normal at 1.49.  Free T3 modestly elevated at 5.7.  TSH depressed at 0.121.  Vitamin D level was normal.  Laboratory studies ordered by her thyroid physician in [place].    Assessment    Doing well post drain removal.    Plan    We see you back in one month.  The patient has very much enjoyed her endocrinologist, Dr. Gracelyn Nurse, but traveling to [facility] has become somewhat cumbersome.  Options for referral to any local endocrinologist was offered.  She will be contacted with recommendations and she can decide if she would like [place] to send a referral.   The patient is having rare drainage from the [facility] drain site and occasional passage of flatus per rectum.  Doing well with stoma management.  Weight gain is encouraging.  We will reassess her progress in 1 month and likely look for a contrast study of the rectum in 2 months, but this will require sedation due to the marked discomfort she experienced with the last exam.  With her modest depression of the TSH, we will recheck her thyroid functions in 6 to 8 weeks.  HPI, Physical Exam, Assessment and Plan have been scribed under the direction and in the presence of [name], MD. [name], [name]  I have completed the exam and reviewed the above documentation for accuracy and completeness.  I agree with the above.  Museum/gallery conservator has been used and any errors in dictation or transcription are unintentional.  Donnalee Curry, M.D., F.A.C.S.  [name] 04/01/2018, 7:19 PM

## 2018-04-02 ENCOUNTER — Encounter: Payer: Self-pay | Admitting: *Deleted

## 2018-04-02 ENCOUNTER — Telehealth: Payer: Self-pay | Admitting: *Deleted

## 2018-04-02 NOTE — Telephone Encounter (Signed)
Notified the patient and she will research and let us know her decision.

## 2018-04-02 NOTE — Telephone Encounter (Signed)
-----   Message from [name], MD sent at 04/01/2018  7:23 PM EDT ----- Please notify the patient that I have had recommendations for endocrinology referral to either Melissa Solum at [facility] or Ernest Haber at [facility] in [place].  The [facility] physician might be most convenient for her.  Let her notify us if she would like [place] to make a referral.

## 2018-04-02 NOTE — Telephone Encounter (Signed)
Change letter for patient

## 2018-05-01 ENCOUNTER — Ambulatory Visit (INDEPENDENT_AMBULATORY_CARE_PROVIDER_SITE_OTHER): Payer: BC Managed Care – PPO | Admitting: General Surgery

## 2018-05-01 ENCOUNTER — Other Ambulatory Visit: Payer: Self-pay

## 2018-05-01 ENCOUNTER — Encounter: Payer: Self-pay | Admitting: General Surgery

## 2018-05-01 VITALS — BP 145/83 | HR 91 | Temp 97.9°F | Resp 18 | Ht 64.0 in | Wt 132.4 lb

## 2018-05-01 DIAGNOSIS — K5792 Diverticulitis of intestine, part unspecified, without perforation or abscess without bleeding: Secondary | ICD-10-CM

## 2018-05-01 DIAGNOSIS — E039 Hypothyroidism, unspecified: Secondary | ICD-10-CM

## 2018-05-01 NOTE — Progress Notes (Signed)
Patient ID: [name], female   DOB: 1961-05-13, 57 y.o.   MRN: [id number]  Chief Complaint  Patient presents with  . Follow-up    1 mo f/u Diverticulitis    HPI [name] is a 57 y.o. female here today for 1 month follow up diverticulitis. Patient states she has no concerns just trying to get use to eating certain foods, but otherwise she feels well.   The patient reports that one weekend when she had salad 3 days in a row she had passage of a little bit of green mucus per rectum.  Less the following weekend when she had this salads 2 days in a row and even less so last weekend when she only ate salad on 1 of the 3 weekend days.  She denies fever, chills or other secondary symptoms to suggest active infection.  She is looking to find an endocrinologist locally instead of traveling to [place].  Will make a referral to Wendall Mola, MD at [facility] endocrinology.   HPI  Past Medical History:  Diagnosis Date  . Diverticulosis   . GERD (gastroesophageal reflux disease)   . Headache    H/O MIGRAINES  . Heart murmur    ASYMPTOMATIC  . Hypothyroidism   . Osteoporosis    borderline  . Thyroid disease    hypothyriod    Past Surgical History:  Procedure Laterality Date  . BREAST SURGERY     sterotatic biopsy on right breast.  . COLONOSCOPY  05/25/2010  . COLONOSCOPY WITH PROPOFOL N/A 12/25/2016   Flexible sigmoidoscopy, post procedure barium enema showing narrowing without mucosal abnormality in the sigmoid.  . INCISION AND DRAINAGE ABSCESS N/A 01/11/2018   Procedure: INCISION AND DRAINAGE PELVIC  ABSCESSdrain placement;  Surgeon: [name], MD;  Location: [facility];  Service: General;  Laterality: N/A;  . LAPAROSCOPIC SIGMOID COLECTOMY N/A 12/02/2017   Procedure: LAPAROSCOPIC ASSISTED COLECTOMY; LOOP ILEOSTOMY;  Surgeon: [name], MD;  Location: [facility];  Service: General;  Laterality: N/A;  . LAPAROTOMY N/A 01/11/2018   Procedure: EXPLORATORY  LAPAROTOMY;  Surgeon: [name], MD;  Location: [facility];  Service: General;  Laterality: N/A;    Family History  Problem Relation Age of Onset  . Heart disease Father        congestive heart failure  . Hypothyroidism Sister   . Arthritis Sister        RA  . Diabetes Maternal Grandmother   . Hypothyroidism Maternal Grandmother   . Alzheimer's disease Paternal Grandmother   . Hypothyroidism Sister     Social History Social History   Tobacco Use  . Smoking status: Former Smoker    Packs/day: 0.25    Years: 15.00    Pack years: 3.75    Types: Cigarettes    Last attempt to quit: 10/16/2017    Years since quitting: 0.5  . Smokeless tobacco: Never Used  Substance Use Topics  . Alcohol use: No  . Drug use: No    No Known Allergies  Current Outpatient Medications  Medication Sig Dispense Refill  . ARMOUR THYROID 90 MG tablet Take 90 mg by mouth daily.  3  . Ascorbic Acid (VITAMIN C) 1000 MG tablet Take 1,000 mg by mouth at bedtime.    . Cholecalciferol (VITAMIN D3) 2000 units TABS Take 2,000 Units by mouth 2 (two) times daily. With lunch & at bedtime.    Marland Kitchen ibuprofen (ADVIL,MOTRIN) 200 MG tablet Take 200 mg by mouth 3 (three) times  daily as needed (for pain/headaches.).    Marland Kitchen. omeprazole (PRILOSEC OTC) 20 MG tablet Take 20 mg by mouth daily as needed (for acid reflux.).     Marland Kitchen. potassium chloride (KLOR-CON) 20 MEQ packet Take by mouth 2 (two) times daily.    . Probiotic Product (PROBIOTIC PO) Take 1 capsule by mouth daily.    Marland Kitchen. thyroid (ARMOUR THYROID) 30 MG tablet Take 1 tablet (30 mg total) by mouth daily before breakfast. Take one 90 mg tablet and one 30 mg tablet daily. 30 tablet 3   No current facility-administered medications for this visit.     Review of Systems Review of Systems  Constitutional: Negative.   Respiratory: Negative.   Cardiovascular: Negative.     Blood pressure (!) 145/83, pulse 91, temperature 97.9 F (36.6 C), temperature source Temporal,  resp. rate 18, height 5\' 4"  (1.626 m), weight 132 lb 6.4 oz (60.1 kg), SpO2 98 %.   Physical Exam Physical Exam  Constitutional: She appears well-developed and well-nourished.  Abdominal: Soft. Normal appearance.      Data Reviewed 7 pound weight gain over the last month.  Assessment    Marked improvement in general sense of well-being, strength and protein replenishment.    Plan She will be 6 months out from her original surgery next month and will look to do a contrast study per rectum, with sedation due to the severe discomfort she experienced several months ago.  This will likely be coordinated with a pelvic CT to better assess the anastomotic area.   HPI, Physical Exam, Assessment and Plan have been scribed under the direction and in the presence of Donnalee CurryJeffrey Laurianne Floresca, Md.  Buddy DutyRanda R. Rubye Oaksickerson, CMA   I have completed the exam and reviewed the above documentation for accuracy and completeness.  I agree with the above.  Museum/gallery conservatorDragon Technology has been used and any errors in dictation or transcription are unintentional.  Donnalee CurryJeffrey Kagan Mutchler, M.D., F.A.C.S.   Merrily PewJeffrey W Danasha Melman 05/01/2018, 9:29 PM  Patient

## 2018-05-05 ENCOUNTER — Telehealth: Payer: Self-pay | Admitting: *Deleted

## 2018-05-05 ENCOUNTER — Other Ambulatory Visit: Payer: Self-pay

## 2018-05-05 DIAGNOSIS — K5792 Diverticulitis of intestine, part unspecified, without perforation or abscess without bleeding: Secondary | ICD-10-CM

## 2018-05-05 DIAGNOSIS — K5732 Diverticulitis of large intestine without perforation or abscess without bleeding: Secondary | ICD-10-CM

## 2018-05-05 DIAGNOSIS — R198 Other specified symptoms and signs involving the digestive system and abdomen: Secondary | ICD-10-CM

## 2018-05-05 NOTE — Telephone Encounter (Signed)
[facility]abel [facility], the referral coordinator did receive records that was sent over on this patient. They will have Dr. Tedd Soto review and then they will contact patient to schedule an appointment. It will take about 3 days to a week for them to contact patient. Patient aware.

## 2018-07-01 ENCOUNTER — Telehealth: Payer: Self-pay

## 2018-07-01 NOTE — Telephone Encounter (Signed)
Spoke with the patient and she would like to wait until Dr Lemar LivingsByrnett returns to see about her imaging studies. We will call the patient once we have a return date for Dr Lemar LivingsByrnett.

## 2018-07-01 NOTE — Telephone Encounter (Signed)
Message left for the patient to call back to the office. Dr Lemar Livings would like to have follow up imaging done on the patient, however she would need sedation for this and he would have to arrange this. This would need to be put off until he returns from personal leave.  She may instead be seen by Dr Riley Nearing at [facility] for follow up and imaging studies.

## 2018-08-06 ENCOUNTER — Telehealth: Payer: Self-pay | Admitting: *Deleted

## 2018-08-06 NOTE — Telephone Encounter (Signed)
Patient contacted and notified per previous note from Dr. Lemar Livings.   Per Caryl-Lyn, patient to be NPO after midnight. Patient verbalizes understanding.

## 2018-08-06 NOTE — Telephone Encounter (Signed)
-----   Message from [name], MD sent at 08/06/2018  2:43 PM EST ----- Patient is to be at [facility] at 6: 30 AM on Wednesday, February 26th..  Orders are in SDS.

## 2018-08-08 NOTE — Addendum Note (Signed)
Addended by: Sinda Du on: 08/08/2018 12:39 PM   Modules accepted: Orders

## 2018-08-13 ENCOUNTER — Encounter
Admission: EM | Disposition: A | Discharge status: Home / Self Care | Payer: Self-pay | Source: Ambulatory Visit | Attending: General Surgery

## 2018-08-13 ENCOUNTER — Ambulatory Visit
Admission: EM | Admit: 2018-08-13 | Discharge: 2018-08-13 | Disposition: A | Discharge status: Home / Self Care | Payer: BC Managed Care – PPO | Source: Ambulatory Visit | Attending: General Surgery | Admitting: General Surgery

## 2018-08-13 ENCOUNTER — Ambulatory Visit: Payer: BC Managed Care – PPO | Admitting: Certified Registered"

## 2018-08-13 ENCOUNTER — Ambulatory Visit
Admission: RE | Admit: 2018-08-13 | Discharge: 2018-08-13 | Disposition: A | Discharge status: Home / Self Care | Payer: BC Managed Care – PPO | Source: Ambulatory Visit | Attending: General Surgery | Admitting: General Surgery

## 2018-08-13 ENCOUNTER — Encounter: Payer: Self-pay | Admitting: Certified Registered"

## 2018-08-13 DIAGNOSIS — K219 Gastro-esophageal reflux disease without esophagitis: Secondary | ICD-10-CM | POA: Insufficient documentation

## 2018-08-13 DIAGNOSIS — Z09 Encounter for follow-up examination after completed treatment for conditions other than malignant neoplasm: Secondary | ICD-10-CM | POA: Insufficient documentation

## 2018-08-13 DIAGNOSIS — Z9049 Acquired absence of other specified parts of digestive tract: Secondary | ICD-10-CM | POA: Diagnosis not present

## 2018-08-13 DIAGNOSIS — E039 Hypothyroidism, unspecified: Secondary | ICD-10-CM | POA: Diagnosis not present

## 2018-08-13 DIAGNOSIS — F17210 Nicotine dependence, cigarettes, uncomplicated: Secondary | ICD-10-CM | POA: Insufficient documentation

## 2018-08-13 DIAGNOSIS — Z932 Ileostomy status: Secondary | ICD-10-CM | POA: Insufficient documentation

## 2018-08-13 DIAGNOSIS — K5792 Diverticulitis of intestine, part unspecified, without perforation or abscess without bleeding: Secondary | ICD-10-CM

## 2018-08-13 DIAGNOSIS — K5732 Diverticulitis of large intestine without perforation or abscess without bleeding: Secondary | ICD-10-CM | POA: Insufficient documentation

## 2018-08-13 DIAGNOSIS — R198 Other specified symptoms and signs involving the digestive system and abdomen: Secondary | ICD-10-CM | POA: Insufficient documentation

## 2018-08-13 DIAGNOSIS — Z79899 Other long term (current) drug therapy: Secondary | ICD-10-CM | POA: Insufficient documentation

## 2018-08-13 SURGERY — EXAM UNDER ANESTHESIA
Anesthesia: Monitor Anesthesia Care

## 2018-08-13 MED ORDER — MIDAZOLAM HCL 5 MG/5ML IJ SOLN
INTRAMUSCULAR | Status: DC | PRN
  Administered 2018-08-13: 2 mg via INTRAVENOUS

## 2018-08-13 MED ORDER — FENTANYL CITRATE (PF) 100 MCG/2ML IJ SOLN: 25.0000 ug | INTRAMUSCULAR | Status: DC | PRN

## 2018-08-13 MED ORDER — ONDANSETRON HCL 4 MG/2ML IJ SOLN
INTRAMUSCULAR | Status: DC | PRN
  Administered 2018-08-13: 4 mg via INTRAVENOUS

## 2018-08-13 MED ORDER — PROPOFOL 10 MG/ML IV BOLUS
INTRAVENOUS | Status: AC
  Filled 2018-08-13: qty 20

## 2018-08-13 MED ORDER — FENTANYL CITRATE (PF) 100 MCG/2ML IJ SOLN
INTRAMUSCULAR | Status: AC
  Filled 2018-08-13: qty 2

## 2018-08-13 MED ORDER — SODIUM CHLORIDE 0.9 % IV SOLN
INTRAVENOUS | Status: DC
  Administered 2018-08-13: 08:00:00 via INTRAVENOUS

## 2018-08-13 MED ORDER — FENTANYL CITRATE (PF) 100 MCG/2ML IJ SOLN
INTRAMUSCULAR | Status: DC | PRN
  Administered 2018-08-13 (×2): 50 ug via INTRAVENOUS

## 2018-08-13 MED ORDER — ONDANSETRON HCL 4 MG/2ML IJ SOLN
INTRAMUSCULAR | Status: AC
  Filled 2018-08-13: qty 2

## 2018-08-13 MED ORDER — MIDAZOLAM HCL 2 MG/2ML IJ SOLN
INTRAMUSCULAR | Status: AC
  Filled 2018-08-13: qty 2

## 2018-08-13 MED ORDER — ONDANSETRON HCL 4 MG/2ML IJ SOLN: 4.0000 mg | Freq: Once | INTRAMUSCULAR | Status: DC | PRN

## 2018-08-13 NOTE — Anesthesia Post-op Follow-up Note (Signed)
Anesthesia QCDR form completed.        

## 2018-08-13 NOTE — Anesthesia Preprocedure Evaluation (Addendum)
Anesthesia Evaluation  Patient identified by MRN, date of birth, ID band Patient awake    Reviewed: Allergy & Precautions, H&P , NPO status , Patient's Chart, lab work & pertinent test results, reviewed documented beta blocker date and time   Airway Mallampati: II   Neck ROM: full    Dental  (+) Poor Dentition   Pulmonary neg pulmonary ROS, Current Smoker,    Pulmonary exam normal        Cardiovascular Exercise Tolerance: Poor negative cardio ROS Normal cardiovascular exam+ Valvular Problems/Murmurs  Rhythm:regular Rate:Normal     Neuro/Psych  Headaches, Anxiety Severe anxiety and unable to tolerate previous procedure secondary to pain/intolerance  and anxiety.  Dr. Fleet Contras has requested anesthesia assistance in this circumstance secondary to the above.  negative neurological ROS  negative psych ROS   GI/Hepatic negative GI ROS, Neg liver ROS, GERD  ,  Endo/Other  negative endocrine ROSHypothyroidism   Renal/GU negative Renal ROS  negative genitourinary   Musculoskeletal   Abdominal   Peds  Hematology negative hematology ROS (+)   Anesthesia Other Findings Past Medical History: No date: Diverticulosis No date: GERD (gastroesophageal reflux disease) No date: Headache     Comment:  H/O MIGRAINES No date: Heart murmur     Comment:  ASYMPTOMATIC No date: Hypothyroidism No date: Osteoporosis     Comment:  borderline No date: Thyroid disease     Comment:  hypothyriod Past Surgical History: No date: BREAST SURGERY     Comment:  sterotatic biopsy on right breast. 05/25/2010: COLONOSCOPY 12/25/2016: COLONOSCOPY WITH PROPOFOL; N/A     Comment:  Flexible sigmoidoscopy, post procedure barium enema               showing narrowing without mucosal abnormality in the               sigmoid. 01/11/2018: INCISION AND DRAINAGE ABSCESS; N/A     Comment:  Procedure: INCISION AND DRAINAGE PELVIC  ABSCESSdrain   placement;  Surgeon: Robert Bellow, MD;  Location:               [facility];  Service: General;  Laterality: N/A; 12/02/2017: LAPAROSCOPIC SIGMOID COLECTOMY; N/A     Comment:  Procedure: LAPAROSCOPIC ASSISTED COLECTOMY; LOOP               ILEOSTOMY;  Surgeon: Robert Bellow, MD;  Location:               [facility] OR[facility];  Service: General;  Laterality: N/A; 01/11/2018: LAPAROTOMY; N/A     Comment:  Procedure: EXPLORATORY LAPAROTOMY;  Surgeon: Robert Bellow, MD;  Location: [facility];  Service: General;                Laterality: N/A;   Reproductive/Obstetrics negative OB ROS                            Anesthesia Physical Anesthesia Plan  ASA: III  Anesthesia Plan: MAC   Post-op Pain Management:    Induction:   PONV Risk Score and Plan:   Airway Management Planned:   Additional Equipment:   Intra-op Plan:   Post-operative Plan:   Informed Consent: I have reviewed the patients History and Physical, chart, labs and discussed the procedure including the risks, benefits and alternatives for the proposed anesthesia with the patient or authorized representative who has indicated  his/her understanding and acceptance.     Dental Advisory Given  Plan Discussed with: CRNA  Anesthesia Plan Comments:         Anesthesia Quick Evaluation

## 2018-08-13 NOTE — Anesthesia Postprocedure Evaluation (Signed)
Anesthesia Post Note  Patient: [name]  Procedure(s) Performed: EXAM UNDER ANESTHESIA (N/A )  Patient location during evaluation: PACU Anesthesia Type: MAC Level of consciousness: awake and alert Pain management: pain level controlled Vital Signs Assessment: post-procedure vital signs reviewed and stable Respiratory status: spontaneous breathing, nonlabored ventilation, respiratory function stable and patient connected to nasal cannula oxygen Cardiovascular status: blood pressure returned to baseline and stable Postop Assessment: no apparent nausea or vomiting Anesthetic complications: no     Last Vitals:  Vitals:   08/13/18 0828  BP: (!) 127/58  Pulse: 68  Resp: 16  SpO2: 100%    Last Pain: There were no vitals filed for this visit.               Molli Barrows

## 2018-08-13 NOTE — Anesthesia Postprocedure Evaluation (Signed)
Anesthesia Post Note  Patient: [name]  Procedure(s) Performed: EXAM UNDER ANESTHESIA (N/A )  Patient location during evaluation: PACU Anesthesia Type: MAC Level of consciousness: awake and alert Pain management: pain level controlled Vital Signs Assessment: post-procedure vital signs reviewed and stable Respiratory status: spontaneous breathing, nonlabored ventilation and respiratory function stable Cardiovascular status: stable and blood pressure returned to baseline Postop Assessment: no apparent nausea or vomiting Anesthetic complications: no     Last Vitals:  Vitals:   08/13/18 0828  BP: (!) 127/58  Pulse: 68  Resp: 16  SpO2: 100%    Last Pain: There were no vitals filed for this visit.               Rolla Plate P

## 2018-08-13 NOTE — Transfer of Care (Signed)
Immediate Anesthesia Transfer of Care Note  Patient: [name]  Procedure(s) Performed: EXAM UNDER ANESTHESIA (N/A )  Patient Location: Short Stay  Anesthesia Type:MAC  Level of Consciousness: awake, alert  and oriented  Airway & Oxygen Therapy: Patient Spontanous Breathing  Post-op Assessment: Report given to RN and Post -op Vital signs reviewed and stable  Post vital signs: Reviewed  Last Vitals:  Vitals Value Taken Time  BP 127/58 08/13/2018  8:28 AM  Temp    Pulse 68 08/13/2018  8:28 AM  Resp 16 08/13/2018  8:28 AM  SpO2 100 % 08/13/2018  8:28 AM    Last Pain: There were no vitals filed for this visit.       Complications: No apparent anesthesia complications

## 2018-08-13 NOTE — Anesthesia Procedure Notes (Signed)
Procedure Name: MAC Date/Time: 08/13/2018 8:10 AM Performed by: Rolla Plate, CRNA Pre-anesthesia Checklist: Patient identified, Patient being monitored, Timeout performed, Emergency Drugs available and Suction available Patient Re-evaluated:Patient Re-evaluated prior to induction Oxygen Delivery Method: Nasal cannula Preoxygenation: Pre-oxygenation with 100% oxygen Induction Type: IV induction Placement Confirmation: breath sounds checked- equal and bilateral

## 2018-08-13 NOTE — H&P (Signed)
CHARLCIE MCCOLGAN [id number] 1960-09-21     HPI:   Patient s/p sigmoid colectomy with diverting ileostomy. For barium study to assess anastomosis. Severe pain with last exam necessitating anesthesia.  Medications Prior to Admission  Medication Sig Dispense Refill Last Dose  . acetaminophen (TYLENOL) 325 MG tablet Take 500 mg by mouth every 6 (six) hours as needed.   Past Week at Unknown time  . ARMOUR THYROID 90 MG tablet Take 90 mg by mouth daily.  3 08/12/2018 at Unknown time  . Ascorbic Acid (VITAMIN C) 1000 MG tablet Take 1,000 mg by mouth at bedtime.   08/11/2018 at Unknown time  . calcium-vitamin D 250-100 MG-UNIT tablet Take 1 tablet by mouth once. Unsure of dose   08/12/2018 at Unknown time  . Cholecalciferol (VITAMIN D3) 2000 units TABS Take 2,000 Units by mouth 2 (two) times daily. With lunch & at bedtime.   08/12/2018 at Unknown time  . ibuprofen (ADVIL,MOTRIN) 200 MG tablet Take 200 mg by mouth 3 (three) times daily as needed (for pain/headaches.).   Taking  . omeprazole (PRILOSEC OTC) 20 MG tablet Take 20 mg by mouth daily as needed (for acid reflux.).    Past Week at Unknown time  . potassium chloride (KLOR-CON) 20 MEQ packet Take by mouth 2 (two) times daily.   Past Month at Unknown time  . Probiotic Product (PROBIOTIC PO) Take 1 capsule by mouth daily.   08/12/2018 at Unknown time  . thyroid (ARMOUR THYROID) 30 MG tablet Take 1 tablet (30 mg total) by mouth daily before breakfast. Take one 90 mg tablet and one 30 mg tablet daily. 30 tablet 3 08/12/2018 at Unknown time   No Known Allergies Past Medical History:  Diagnosis Date  . Diverticulosis   . GERD (gastroesophageal reflux disease)   . Headache    H/O MIGRAINES  . Heart murmur    ASYMPTOMATIC  . Hypothyroidism   . Osteoporosis    borderline  . Thyroid disease    hypothyriod   Past Surgical History:  Procedure Laterality Date  . BREAST SURGERY     sterotatic biopsy on right breast.  . COLONOSCOPY  05/25/2010  .  COLONOSCOPY WITH PROPOFOL N/A 12/25/2016   Flexible sigmoidoscopy, post procedure barium enema showing narrowing without mucosal abnormality in the sigmoid.  . INCISION AND DRAINAGE ABSCESS N/A 01/11/2018   Procedure: INCISION AND DRAINAGE PELVIC  ABSCESSdrain placement;  Surgeon: [name], MD;  Location: [facility];  Service: General;  Laterality: N/A;  . LAPAROSCOPIC SIGMOID COLECTOMY N/A 12/02/2017   Procedure: LAPAROSCOPIC ASSISTED COLECTOMY; LOOP ILEOSTOMY;  Surgeon: [name], MD;  Location: [facility];  Service: General;  Laterality: N/A;  . LAPAROTOMY N/A 01/11/2018   Procedure: EXPLORATORY LAPAROTOMY;  Surgeon: [name], MD;  Location: [facility];  Service: General;  Laterality: N/A;   Social History   Socioeconomic History  . Marital status: Married    Spouse name: Not on file  . Number of children: Not on file  . Years of education: Not on file  . Highest education level: Not on file  Occupational History  . Not on file  Social Needs  . Financial resource strain: Not on file  . Food insecurity:    Worry: Not on file    Inability: Not on file  . Transportation needs:    Medical: Not on file    Non-medical: Not on file  Tobacco Use  . Smoking status: Current Some Day Smoker  Packs/day: 0.25    Years: 15.00    Pack years: 3.75    Types: Cigarettes    Last attempt to quit: 10/16/2017    Years since quitting: 0.8  . Smokeless tobacco: Never Used  Substance and Sexual Activity  . Alcohol use: No  . Drug use: No  . Sexual activity: Not Currently    Partners: Male    Birth control/protection: Post-menopausal  Lifestyle  . Physical activity:    Days per week: Not on file    Minutes per session: Not on file  . Stress: Not on file  Relationships  . Social connections:    Talks on phone: Not on file    Gets together: Not on file    Attends religious service: Not on file    Active member of club or organization: Not on file    Attends meetings of  clubs or organizations: Not on file    Relationship status: Not on file  . Intimate partner violence:    Fear of current or ex partner: Not on file    Emotionally abused: Not on file    Physically abused: Not on file    Forced sexual activity: Not on file  Other Topics Concern  . Not on file  Social History Narrative  . Not on file    Social History   Social History Narrative  . Not on file     ROS: Negative.     PE: HEENT: Negative. Lungs: Clear. Cardio: RRMerrily Pew Byrnett 08/13/2018   Assessment/Plan:   Limited barium enema under anesthesia.

## 2018-08-13 NOTE — Discharge Instructions (Signed)

## 2018-08-13 NOTE — Progress Notes (Signed)
Ch met w/ pt while in pre-op. Pt is not having surgery but is seen by radiology. Pt shared that she has a colonoscopy bag that she has had since last June. Ch shared that healing is a process and pt agreed as she has had a change in perspective since her surgery. She shared that she had a few infections after the surgery but has not had any major issues with the colonoscopy bag which she hope to no longer have. Ch provided words of comfort for pt as the RT came to take pt back.

## 2018-08-19 ENCOUNTER — Other Ambulatory Visit: Payer: Self-pay

## 2018-08-19 ENCOUNTER — Ambulatory Visit (INDEPENDENT_AMBULATORY_CARE_PROVIDER_SITE_OTHER): Payer: BC Managed Care – PPO | Admitting: General Surgery

## 2018-08-19 ENCOUNTER — Encounter: Payer: Self-pay | Admitting: General Surgery

## 2018-08-19 VITALS — BP 122/68 | HR 73 | Temp 97.8°F | Ht 64.0 in | Wt 140.0 lb

## 2018-08-19 DIAGNOSIS — K913 Postprocedural intestinal obstruction, unspecified as to partial versus complete: Secondary | ICD-10-CM | POA: Diagnosis not present

## 2018-08-19 NOTE — Progress Notes (Signed)
Patient ID: [name], female   DOB: 03-29-1961, 58 y.o.   MRN: [id number]  Chief Complaint  Patient presents with  . Other    HPI [name] is a 58 y.o. female here today for her follow up barium enema done on 08/13/2018.  This was completed with propofol sedation due to the severe pain she had experienced on prior study.  Since the patient's last exam in November 2019 she is gained 6 pounds, reports normal energy and comfortable management of her ileostomy.  HPI  Past Medical History:  Diagnosis Date  . Diverticulosis   . GERD (gastroesophageal reflux disease)   . Headache    H/O MIGRAINES  . Heart murmur    ASYMPTOMATIC  . Hypothyroidism   . Osteoporosis    borderline  . Thyroid disease    hypothyriod    Past Surgical History:  Procedure Laterality Date  . BREAST SURGERY     sterotatic biopsy on right breast.  . COLONOSCOPY  05/25/2010  . COLONOSCOPY WITH PROPOFOL N/A 12/25/2016   Flexible sigmoidoscopy, post procedure barium enema showing narrowing without mucosal abnormality in the sigmoid.  . INCISION AND DRAINAGE ABSCESS N/A 01/11/2018   Procedure: INCISION AND DRAINAGE PELVIC  ABSCESSdrain placement;  Surgeon: [name], MD;  Location: [facility] ORS;  Service: General;  Laterality: N/A;  . LAPAROSCOPIC SIGMOID COLECTOMY N/A 12/02/2017   Procedure: LAPAROSCOPIC ASSISTED COLECTOMY; LOOP ILEOSTOMY;  Surgeon: [name], MD;  Location: [facility] ORS;  Service: General;  Laterality: N/A;  . LAPAROTOMY N/A 01/11/2018   Procedure: EXPLORATORY LAPAROTOMY;  Surgeon: [name], MD;  Location: [facility] ORS;  Service: General;  Laterality: N/A;    Family History  Problem Relation Age of Onset  . Heart disease Father        congestive heart failure  . Hypothyroidism Sister   . Arthritis Sister        RA  . Diabetes Maternal Grandmother   . Hypothyroidism Maternal Grandmother   . Alzheimer's disease Paternal Grandmother   . Hypothyroidism Sister      Social History Social History   Tobacco Use  . Smoking status: Current Some Day Smoker    Packs/day: 0.25    Years: 15.00    Pack years: 3.75    Types: Cigarettes    Last attempt to quit: 10/16/2017    Years since quitting: 0.8  . Smokeless tobacco: Never Used  Substance Use Topics  . Alcohol use: No  . Drug use: No    No Known Allergies  Current Outpatient Medications  Medication Sig Dispense Refill  . acetaminophen (TYLENOL) 325 MG tablet Take 500 mg by mouth every 6 (six) hours as needed.    Mack Guise THYROID 90 MG tablet Take 90 mg by mouth daily.  3  . Ascorbic Acid (VITAMIN C) 1000 MG tablet Take 1,000 mg by mouth at bedtime.    . calcium-vitamin D 250-100 MG-UNIT tablet Take 1 tablet by mouth once. Unsure of dose    . Cholecalciferol (VITAMIN D3) 2000 units TABS Take 2,000 Units by mouth 2 (two) times daily. With lunch & at bedtime.    Marland Kitchen ibuprofen (ADVIL,MOTRIN) 200 MG tablet Take 200 mg by mouth 3 (three) times daily as needed (for pain/headaches.).    Marland Kitchen omeprazole (PRILOSEC OTC) 20 MG tablet Take 20 mg by mouth daily as needed (for acid reflux.).     Marland Kitchen potassium chloride (KLOR-CON) 20 MEQ packet Take by mouth 2 (two) times daily.    Marland Kitchen  Probiotic Product (PROBIOTIC PO) Take 1 capsule by mouth daily.    Marland Kitchen thyroid (ARMOUR THYROID) 30 MG tablet Take 1 tablet (30 mg total) by mouth daily before breakfast. Take one 90 mg tablet and one 30 mg tablet daily. 30 tablet 3   No current facility-administered medications for this visit.     Review of Systems Review of Systems  Constitutional: Negative.   Respiratory: Negative.   Cardiovascular: Negative.     Blood pressure 122/68, pulse 73, temperature 97.8 F (36.6 C), temperature source Skin, height 5\' 4"  (1.626 m), weight 140 lb (63.5 kg), SpO2 98 %.  Physical Exam Physical Exam Constitutional:      Appearance: Normal appearance.  Skin:    General: Skin is warm and dry.  Neurological:     Mental Status: She is  alert and oriented to person, place, and time.     Data Reviewed I was present while the limited barium enema was completed.  There is a long narrowing perhaps 5 mm in diameter from the upper rectum to the colon proximal.  No distention of this area with increased pressure was noted.  Assessment Post anastomotic stricture status post sigmoid resection.  Plan   Options at this time include continuing life of the ileostomy versus conversion of the ileostomy to a permanent colostomy versus university consultation.  The patient reports while she does go out to eat she is reluctant to spend long periods of time after meals out of the home as she does not want the ileostomy functioning while she is with company.   I think this patient would be best evaluated by the colorectal service at [facility].  I have spoken to Riley Nearing, MD and he is amenable to evaluate the patient.  During today's visit the patient's daughter who is a Engineer, civil (consulting) at [employer] was on the phone and she was agreeable to having this consultation completed.    We will arrange to have all of her imaging studies placed on the disc so that her upcoming evaluation can be complete in 1 visit.  HPI, Physical Exam, Assessment and Plan have been scribed under the direction and in the presence of Donnalee Curry, MD.  Ples Specter, CMA  I have completed the exam and reviewed the above documentation for accuracy and completeness.  I agree with the above.  Museum/gallery conservator has been used and any errors in dictation or transcription are unintentional.  Donnalee Curry, M.D., F.A.C.S.  [place] 08/19/2018, 8:36 PM

## 2018-08-21 ENCOUNTER — Telehealth: Payer: Self-pay

## 2018-08-21 ENCOUNTER — Other Ambulatory Visit: Payer: Self-pay

## 2018-08-21 DIAGNOSIS — K913 Postprocedural intestinal obstruction, unspecified as to partial versus complete: Secondary | ICD-10-CM

## 2018-08-21 NOTE — Telephone Encounter (Signed)
A referral has been sent to Dr Riley Nearing at [facility]. They will be contacting the patient to schedule an appointment. All records have been faxed to their office. The patient has their contact information.

## 2018-08-21 NOTE — Telephone Encounter (Signed)
-----   Message from [name], MD sent at 08/19/2018  4:36 PM EST ----- Please make a referral to Riley Nearing, MD; [facility] [address], [address]. [place]  [address]  Office: [phone], fax: [phone]  Reason for referral: Anastomotic stricture post sigmoid resection. I have discussed the case with him.

## 2018-08-27 ENCOUNTER — Telehealth: Payer: Self-pay

## 2018-08-27 NOTE — Telephone Encounter (Signed)
-----   Message from [name], MD sent at 08/27/2018 12:38 PM EDT ----- Please have [facility] put her imaging studies April 18 to the present on a disc for the patient to pick up and hand carry to the appointment. Thanks.   Send a copy of all of the patient's operative reports to Dr. Elenore Rota. Thanks.  ----- Message ----- From: Sinda Du, LPN Sent: 1/83/6725  12:30 PM EDT To: [name], MD  It's 09/01/18 at 10:30 am ----- Message ----- From: [name], MD Sent: 08/27/2018  12:23 PM EDT To: Sinda Du, LPN  Let me know when her appointment is scheduled with Dr. Elenore Rota so we can get her imaging studies on a disc and I can send him a letter. Thanks.

## 2018-08-27 NOTE — Telephone Encounter (Signed)
Patient notified that we are having all of her imaging studies put on a disk for her to take with her to her appointment with Dr Elenore Rota. She is aware to pick up this on Friday before 5 pm at the Radiology desk in the [facility] [place] at [facility].

## 2018-08-29 IMAGING — RF DG BE LIMITED  W/ CM
15 of 23 series · 15 of 23 positions shown · IV contrast (agent unspecified)
Comparison: CT 01/07/2018.

CLINICAL DATA: Stricture sigmoid colon.  Evaluate for bowel leak.

EXAM:
BE LIMITED WITH CONTRAST
CONTRAST:  Water-soluble.
FLUOROSCOPY TIME:  Fluoroscopy Time:  5 minutes 48 seconds
Radiation Exposure Index (if provided by the fluoroscopic device):
147.9 mGy
Number of Acquired Spot Images: 23

[Series 1: t abdomen supine · 0.15mm/px · 1 of 1 slices shown]
[im 1/1]
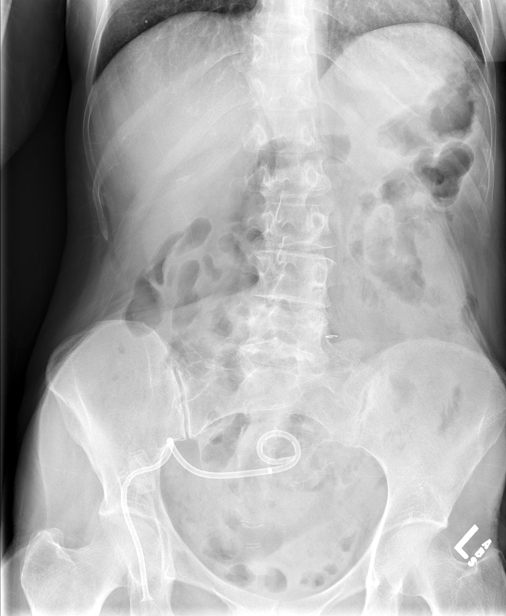

[Series 3: fluoro_barium singleshot_bw · 0.18mm/px · 1 of 1 slices shown (1 of 14)]
[im 1/1]
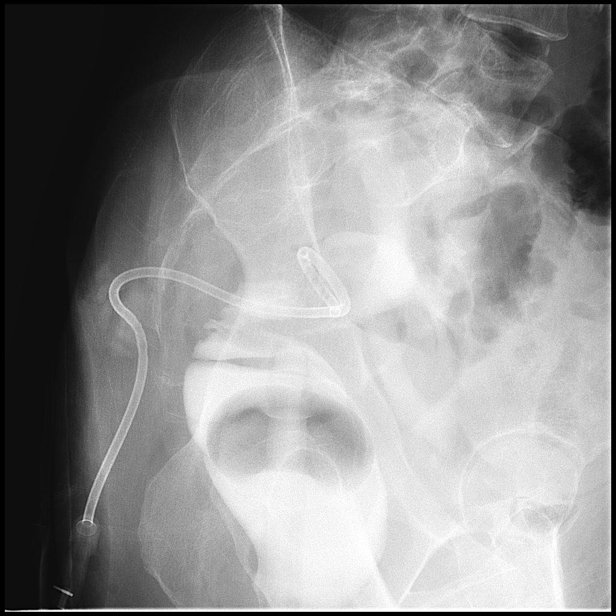

[Series 4: fluoro_barium singleshot_bw · 0.18mm/px · 1 of 1 slices shown (2 of 14)]
[im 1/1]
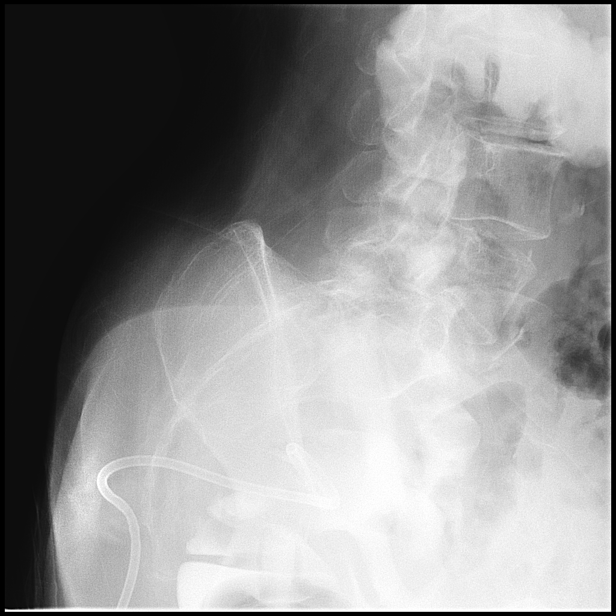

[Series 6: fluoro_barium singleshot_bw · 0.18mm/px · 1 of 1 slices shown (3 of 14)]
[im 1/1]
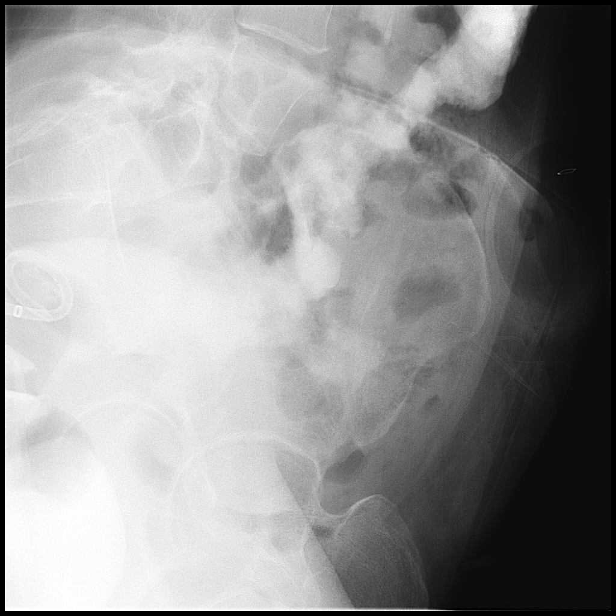

[Series 7: fluoro_barium singleshot_bw · 0.18mm/px · 1 of 1 slices shown (4 of 14)]
[im 1/1]
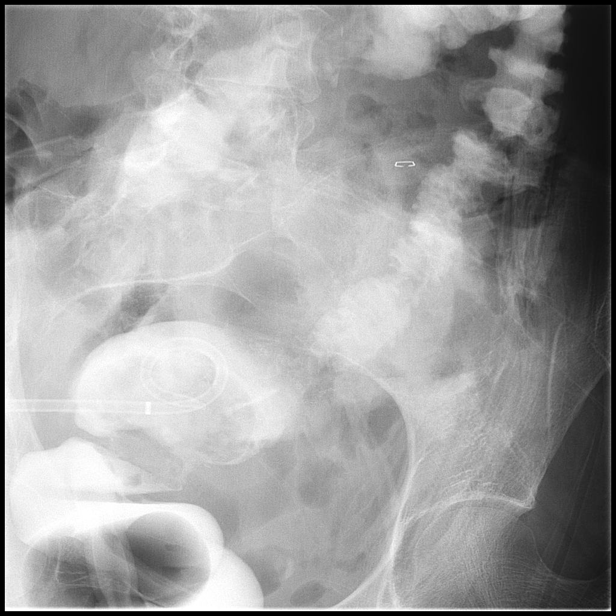

[Series 9: fluoro_barium singleshot_bw · 0.18mm/px · 1 of 1 slices shown (5 of 14)]
[im 1/1]
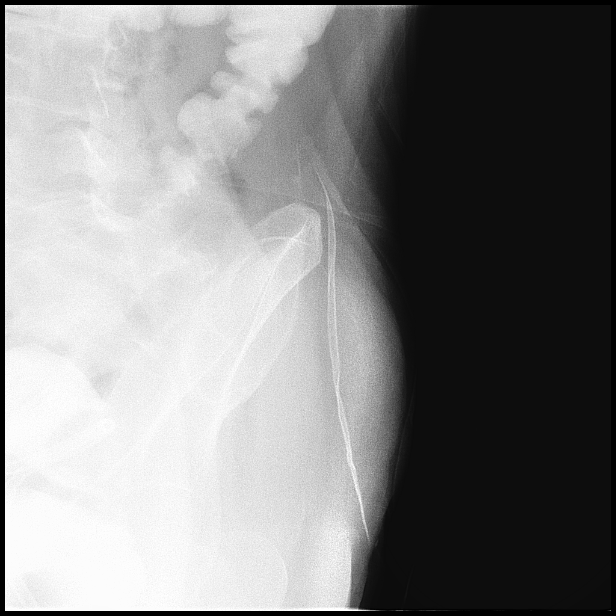

[Series 10: fluoro_barium singleshot_bw · 0.18mm/px · 1 of 1 slices shown (6 of 14)]
[im 1/1]
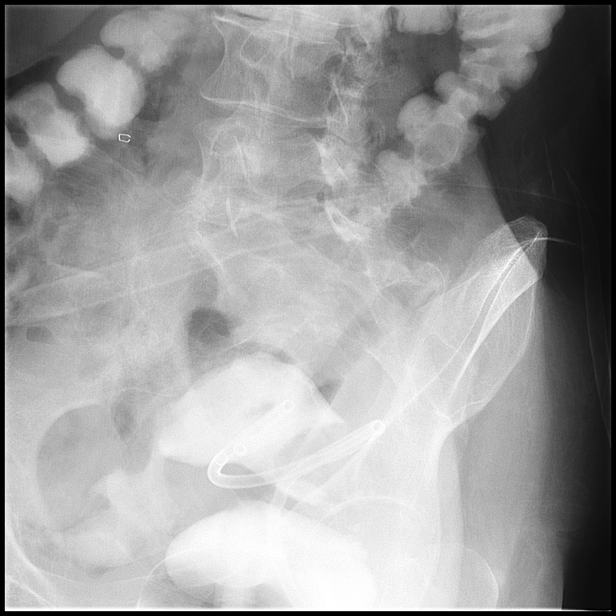

[Series 12: fluoro_barium singleshot_bw · 0.18mm/px · 1 of 1 slices shown (7 of 14)]
[im 1/1]
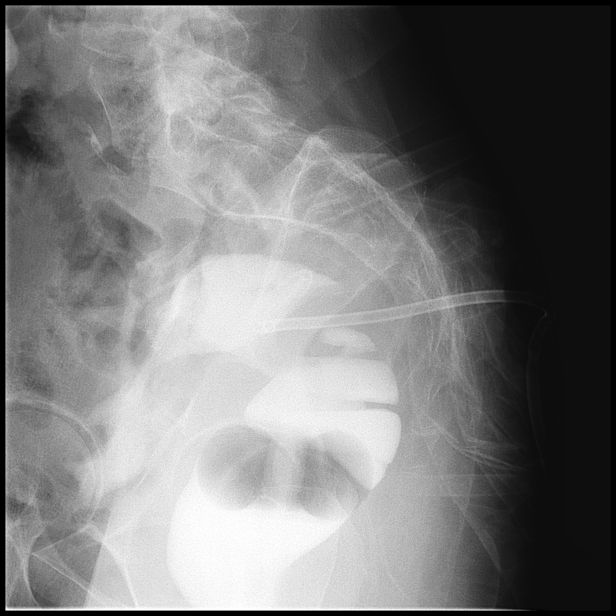

[Series 14: fluoro_barium singleshot_bw · 0.18mm/px · 1 of 1 slices shown (8 of 14)]
[im 1/1]
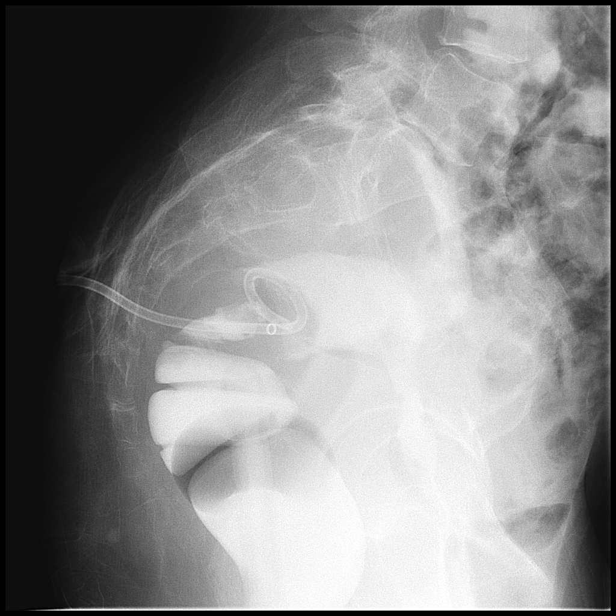

[Series 15: fluoro_barium singleshot_bw · 0.18mm/px · 1 of 1 slices shown (9 of 14)]
[im 1/1]
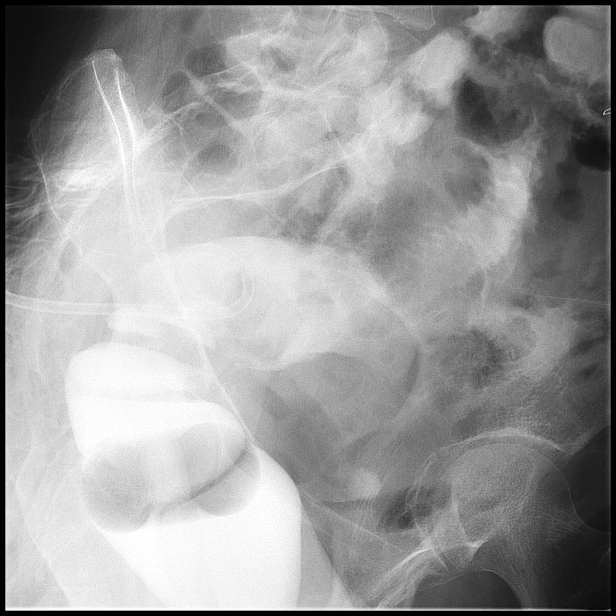

[Series 17: fluoro_barium singleshot_bw · 0.18mm/px · 1 of 1 slices shown (10 of 14)]
[im 1/1]
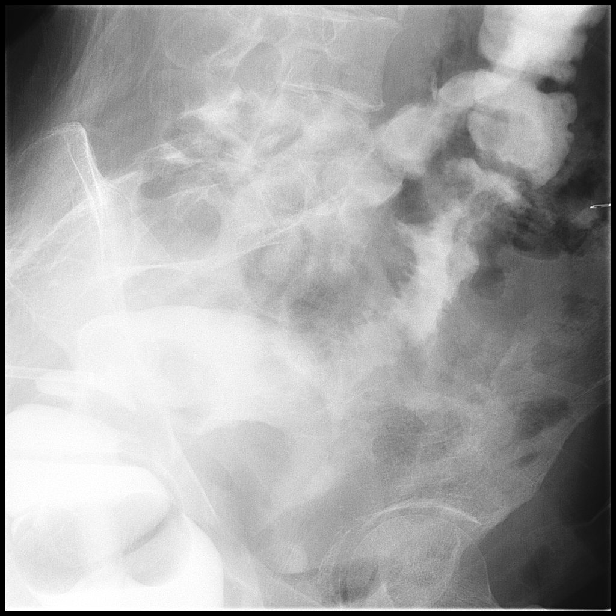

[Series 18: fluoro_barium singleshot_bw · 0.18mm/px · 1 of 1 slices shown (11 of 14)]
[im 1/1]
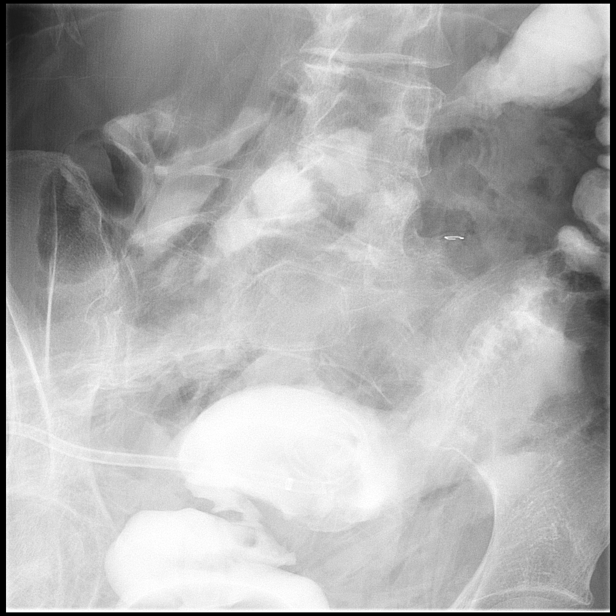

[Series 20: fluoro_barium singleshot_bw · 0.18mm/px · 1 of 1 slices shown (12 of 14)]
[im 1/1  full-range]
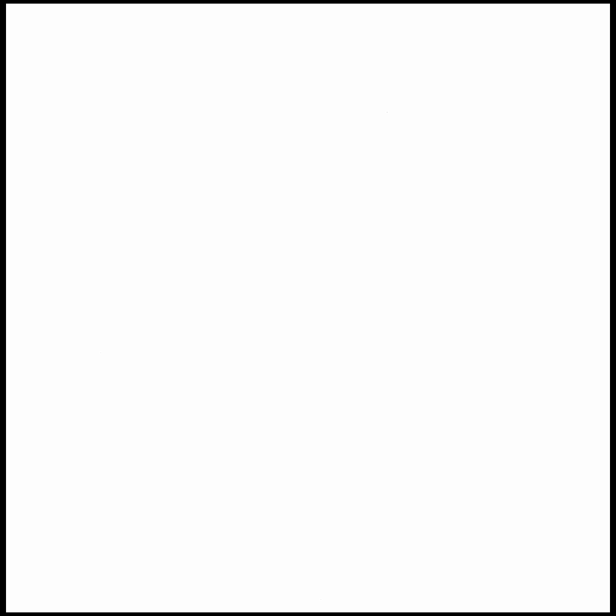

[Series 21: fluoro_barium singleshot_bw · 0.19mm/px · 1 of 1 slices shown (13 of 14)]
[im 1/1]
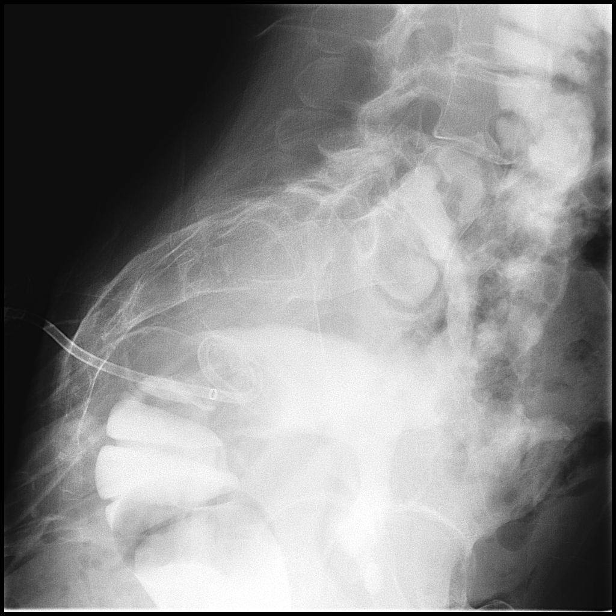

[Series 23: fluoro_barium singleshot_bw · 0.19mm/px · 1 of 1 slices shown (14 of 14)]
[im 1/1]
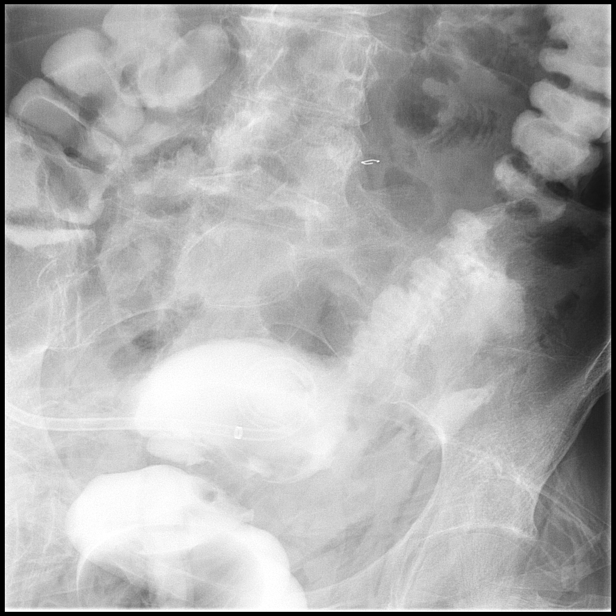

[15 of 23 positions shown; findings below may reference images not displayed]

FINDINGS: Limited exam due to patient's clinical condition. Contrast fills the
rectum and courses through sigmoid anastomosis and into the left
colon. Contrast fills the previously identified perirectal cavity.
Drainage tube is again noted within this cavity.
IMPRESSION: Postsurgical changes with contrast leaking from the sigmoid colon
into a perirectal cavity. Drainage is within the cavity. No evidence
of colonic obstruction.

## 2018-09-14 DIAGNOSIS — F17200 Nicotine dependence, unspecified, uncomplicated: Secondary | ICD-10-CM | POA: Insufficient documentation

## 2018-12-18 ENCOUNTER — Telehealth: Payer: Self-pay | Admitting: General Surgery

## 2018-12-18 NOTE — Telephone Encounter (Signed)
Touched base with the patient about visit at [facility] with Dr. Audie Clear.  Surgery had originally set for April, postponed secondary to the pandemic.  Tentatively, scheduled for the third week of July.   Feeling well, weight up to 145#.

## 2019-08-16 ENCOUNTER — Ambulatory Visit: Payer: BC Managed Care – PPO | Attending: Internal Medicine

## 2019-08-16 DIAGNOSIS — Z23 Encounter for immunization: Secondary | ICD-10-CM | POA: Insufficient documentation

## 2019-08-16 NOTE — Progress Notes (Signed)
   Covid-19 Vaccination Clinic  Name:  [name]    MRN: [id number] DOB: 1960/12/04  08/16/2019  [name] was observed post Covid-19 immunization for 15 minutes without incidence. She was provided with Vaccine Information Sheet and instruction to access the V-Safe system.   [name] was instructed to call 911 with any severe reactions post vaccine: Marland Kitchen Difficulty breathing  . Swelling of your face and throat  . A fast heartbeat  . A bad rash all over your body  . Dizziness and weakness    Immunizations Administered    Name Date Dose VIS Date Route   Pfizer COVID-19 Vaccine 08/16/2019  2:55 PM 0.3 mL 05/29/2019 Intramuscular   Manufacturer: [employer], [employer]   Lot: [id number]   NDC: [address]-0

## 2019-09-12 ENCOUNTER — Ambulatory Visit: Payer: BC Managed Care – PPO

## 2019-09-16 ENCOUNTER — Ambulatory Visit: Payer: BC Managed Care – PPO | Attending: Internal Medicine

## 2019-09-16 DIAGNOSIS — Z23 Encounter for immunization: Secondary | ICD-10-CM

## 2019-09-16 NOTE — Progress Notes (Signed)
   Covid-19 Vaccination Clinic  Name:  [name]    MRN: [id number] DOB: August 06, 1960  09/16/2019  [name] was observed post Covid-19 immunization for 15 minutes without incident. She was provided with Vaccine Information Sheet and instruction to access the V-Safe system.   [name] was instructed to call 911 with any severe reactions post vaccine: Marland Kitchen Difficulty breathing  . Swelling of face and throat  . A fast heartbeat  . A bad rash all over body  . Dizziness and weakness   Immunizations Administered    Name Date Dose VIS Date Route   Pfizer COVID-19 Vaccine 09/16/2019  9:23 AM 0.3 mL 05/29/2019 Intramuscular   Manufacturer: [employer], [employer]   Lot: [id number]   NDC: [address]-8

## 2023-02-19 ENCOUNTER — Ambulatory Visit (INDEPENDENT_AMBULATORY_CARE_PROVIDER_SITE_OTHER): Payer: BC Managed Care – PPO | Admitting: Cardiology

## 2023-02-19 ENCOUNTER — Ambulatory Visit
Admission: RE | Admit: 2023-02-19 | Discharge: 2023-02-19 | Disposition: A | Discharge status: Home / Self Care | Payer: BC Managed Care – PPO | Attending: Cardiology | Admitting: Cardiology

## 2023-02-19 ENCOUNTER — Encounter: Payer: Self-pay | Admitting: Cardiology

## 2023-02-19 ENCOUNTER — Ambulatory Visit
Admission: RE | Admit: 2023-02-19 | Discharge: 2023-02-19 | Disposition: A | Discharge status: Home / Self Care | Payer: BC Managed Care – PPO | Source: Ambulatory Visit | Attending: Cardiology | Admitting: Cardiology

## 2023-02-19 VITALS — BP 154/90 | HR 73 | Ht 64.0 in | Wt 142.0 lb

## 2023-02-19 DIAGNOSIS — M79672 Pain in left foot: Secondary | ICD-10-CM

## 2023-02-19 DIAGNOSIS — Z13220 Encounter for screening for lipoid disorders: Secondary | ICD-10-CM | POA: Insufficient documentation

## 2023-02-19 DIAGNOSIS — Z131 Encounter for screening for diabetes mellitus: Secondary | ICD-10-CM

## 2023-02-19 DIAGNOSIS — E039 Hypothyroidism, unspecified: Secondary | ICD-10-CM | POA: Diagnosis not present

## 2023-02-19 LAB — HM MAMMOGRAPHY

## 2023-02-19 NOTE — Progress Notes (Signed)
Established Patient Office Visit  Subjective:  Patient ID: [name], female    DOB: 18-Jun-1961  Age: 62 y.o. MRN: [id number]  Chief Complaint  Patient presents with   Acute Visit    Left foot pain for a few weeks    Patient in office complaining of left foot pain. Patient states she dropped a shampoo bottle on the top of her foot a few weeks ago. Patient reports icing it and taking ibuprofen with some relief. Foot continues to have intermittent edema. Will order an xray and call patient with results. Patient's most recent blood work 2021. Patient is fasting today, will order blood work.   Foot Injury  The incident occurred more than 1 week ago. The incident occurred at home. The injury mechanism was a direct blow. The pain is present in the left foot. The quality of the pain is described as aching. The pain is mild. The pain has been Fluctuating since onset. Pertinent negatives include no inability to bear weight, loss of motion, numbness or tingling. Associated symptoms comments: edema. She reports no foreign bodies present. The symptoms are aggravated by movement. She has tried ice, non-weight bearing and NSAIDs for the symptoms. The treatment provided no relief.    No other concerns at this time.   Past Medical History:  Diagnosis Date   Diverticulosis    GERD (gastroesophageal reflux disease)    Headache    H/O MIGRAINES   Heart murmur    ASYMPTOMATIC   Hypothyroidism    Osteoporosis    borderline   Thyroid disease    hypothyriod    Past Surgical History:  Procedure Laterality Date   BREAST SURGERY     sterotatic biopsy on right breast.   COLONOSCOPY  05/25/2010   COLONOSCOPY WITH PROPOFOL N/A 12/25/2016   Flexible sigmoidoscopy, post procedure barium enema showing narrowing without mucosal abnormality in the sigmoid.   INCISION AND DRAINAGE ABSCESS N/A 01/11/2018   Procedure: INCISION AND DRAINAGE PELVIC  ABSCESSdrain placement;  Surgeon: [name],  MD;  Location: [facility] ORS;  Service: General;  Laterality: N/A;   LAPAROSCOPIC SIGMOID COLECTOMY N/A 12/02/2017   Procedure: LAPAROSCOPIC ASSISTED COLECTOMY; LOOP ILEOSTOMY;  Surgeon: [name], MD;  Location: [facility] ORS;  Service: General;  Laterality: N/A;   LAPAROTOMY N/A 01/11/2018   Procedure: EXPLORATORY LAPAROTOMY;  Surgeon: [name], MD;  Location: [facility] ORS;  Service: General;  Laterality: N/A;    Social History   Socioeconomic History   Marital status: Married    Spouse name: Not on file   Number of children: Not on file   Years of education: Not on file   Highest education level: Not on file  Occupational History   Not on file  Tobacco Use   Smoking status: Some Days    Current packs/day: 0.00    Average packs/day: 0.3 packs/day for 15.0 years (3.8 ttl pk-yrs)    Types: Cigarettes    Start date: 10/17/2002    Last attempt to quit: 10/16/2017    Years since quitting: 5.3   Smokeless tobacco: Never  Vaping Use   Vaping status: Never Used  Substance and Sexual Activity   Alcohol use: No   Drug use: No   Sexual activity: Not Currently    Partners: Male    Birth control/protection: Post-menopausal  Other Topics Concern   Not on file  Social History Narrative   Not on file   Social Determinants of Health   Financial Resource  Strain: Not on file  Food Insecurity: Not on file  Transportation Needs: Not on file  Physical Activity: Not on file  Stress: Not on file  Social Connections: Not on file  Intimate Partner Violence: Not on file    Family History  Problem Relation Age of Onset   Heart disease Father        congestive heart failure   Hypothyroidism Sister    Arthritis Sister        RA   Diabetes Maternal Grandmother    Hypothyroidism Maternal Grandmother    Alzheimer's disease Paternal Grandmother    Hypothyroidism Sister     No Known Allergies  Review of Systems  Constitutional: Negative.   HENT: Negative.    Eyes: Negative.    Respiratory: Negative.  Negative for shortness of breath.   Cardiovascular: Negative.  Negative for chest pain.  Gastrointestinal: Negative.  Negative for abdominal pain, constipation and diarrhea.  Genitourinary: Negative.   Musculoskeletal:  Negative for joint pain and myalgias.  Skin: Negative.   Neurological: Negative.  Negative for dizziness, tingling, numbness and headaches.  Endo/Heme/Allergies: Negative.   All other systems reviewed and are negative.      Objective:   BP (!) 154/90   Pulse 73   Ht 5\' 4"  (1.626 m)   Wt 142 lb (64.4 kg)   SpO2 95%   BMI 24.37 kg/m   Vitals:   02/19/23 1046  BP: (!) 154/90  Pulse: 73  Height: 5\' 4"  (1.626 m)  Weight: 142 lb (64.4 kg)  SpO2: 95%  BMI (Calculated): 24.36    Physical Exam Vitals and nursing note reviewed.  Constitutional:      Appearance: Normal appearance. She is normal weight.  HENT:     Head: Normocephalic and atraumatic.     Nose: Nose normal.     Mouth/Throat:     Mouth: Mucous membranes are moist.  Eyes:     Extraocular Movements: Extraocular movements intact.     Conjunctiva/sclera: Conjunctivae normal.     Pupils: Pupils are equal, round, and reactive to light.  Cardiovascular:     Rate and Rhythm: Normal rate and regular rhythm.     Pulses: Normal pulses.     Heart sounds: Normal heart sounds.  Pulmonary:     Effort: Pulmonary effort is normal.     Breath sounds: Normal breath sounds.  Abdominal:     General: Abdomen is flat. Bowel sounds are normal.     Palpations: Abdomen is soft.  Musculoskeletal:        General: Normal range of motion.     Cervical back: Normal range of motion.  Skin:    General: Skin is warm and dry.  Neurological:     General: No focal deficit present.     Mental Status: She is alert and oriented to person, place, and time.  Psychiatric:        Mood and Affect: Mood normal.        Behavior: Behavior normal.        Thought Content: Thought content normal.         Judgment: Judgment normal.      No results found for any visits on 02/19/23.  No results found for this or any previous visit (from the past 2160 hour(s)).    Assessment & Plan:  Left foot xray today. Fasting blood work today.  Problem List Items Addressed This Visit       Endocrine   Hypothyroidism  Relevant Orders   TSH   CBC with Differential/Platelet     Other   Lipid screening   Relevant Orders   Lipid Profile   CBC with Differential/Platelet   Left foot pain - Primary   Relevant Orders   DG Foot Complete Left   CBC with Differential/Platelet   Other Visit Diagnoses     Diabetes mellitus screening       Relevant Orders   CMP14+EGFR   Hemoglobin A1c   CBC with Differential/Platelet       Return in about 4 months (around 06/21/2023).   Total time spent: 25 minutes  [employer], NP  02/19/2023   This document may have been prepared by Dragon Voice Recognition software and as such may include unintentional dictation errors.

## 2023-02-20 LAB — CBC WITH DIFFERENTIAL/PLATELET
Basophils Absolute: 0.1 10*3/uL (ref 0.0–0.2)
Basos: 1 %
EOS (ABSOLUTE): 0 10*3/uL (ref 0.0–0.4)
Eos: 0 %
Hematocrit: 38.7 % (ref 34.0–46.6)
Hemoglobin: 12.7 g/dL (ref 11.1–15.9)
Immature Grans (Abs): 0 10*3/uL (ref 0.0–0.1)
Immature Granulocytes: 0 %
Lymphocytes Absolute: 1.8 10*3/uL (ref 0.7–3.1)
Lymphs: 24 %
MCH: 28.9 pg (ref 26.6–33.0)
MCHC: 32.8 g/dL (ref 31.5–35.7)
MCV: 88 fL (ref 79–97)
Monocytes Absolute: 0.5 10*3/uL (ref 0.1–0.9)
Monocytes: 6 %
Neutrophils Absolute: 4.9 10*3/uL (ref 1.4–7.0)
Neutrophils: 69 %
Platelets: 346 10*3/uL (ref 150–450)
RBC: 4.4 x10E6/uL (ref 3.77–5.28)
RDW: 13.1 % (ref 11.7–15.4)
WBC: 7.2 10*3/uL (ref 3.4–10.8)

## 2023-02-20 LAB — TSH: TSH: 0.58 u[IU]/mL (ref 0.450–4.500)

## 2023-02-20 LAB — CMP14+EGFR
ALT: 10 IU/L (ref 0–32)
AST: 10 IU/L (ref 0–40)
Albumin: 3.8 g/dL — ABNORMAL LOW (ref 3.9–4.9)
Alkaline Phosphatase: 105 IU/L (ref 44–121)
BUN/Creatinine Ratio: 21 (ref 12–28)
BUN: 14 mg/dL (ref 8–27)
Bilirubin Total: 0.6 mg/dL (ref 0.0–1.2)
CO2: 24 mmol/L (ref 20–29)
Calcium: 9.4 mg/dL (ref 8.7–10.3)
Chloride: 104 mmol/L (ref 96–106)
Creatinine, Ser: 0.68 mg/dL (ref 0.57–1.00)
Globulin, Total: 2.8 g/dL (ref 1.5–4.5)
Glucose: 79 mg/dL (ref 70–99)
Potassium: 3.9 mmol/L (ref 3.5–5.2)
Sodium: 143 mmol/L (ref 134–144)
Total Protein: 6.6 g/dL (ref 6.0–8.5)
eGFR: 98 mL/min/{1.73_m2} (ref >59)

## 2023-02-20 LAB — LIPID PANEL
Chol/HDL Ratio: 3 ratio (ref 0.0–4.4)
Cholesterol, Total: 148 mg/dL (ref 100–199)
HDL: 49 mg/dL (ref >39)
LDL Chol Calc (NIH): 87 mg/dL (ref 0–99)
Triglycerides: 60 mg/dL (ref 0–149)
VLDL Cholesterol Cal: 12 mg/dL (ref 5–40)

## 2023-02-20 LAB — HEMOGLOBIN A1C
Est. average glucose Bld gHb Est-mCnc: 111 mg/dL
Hgb A1c MFr Bld: 5.5 % (ref 4.8–5.6)

## 2023-02-20 NOTE — Progress Notes (Signed)
Patient notified

## 2023-02-22 ENCOUNTER — Telehealth: Payer: Self-pay | Admitting: Cardiology

## 2023-02-22 NOTE — Telephone Encounter (Signed)
Patient left VM requesting her x-ray results. Advised her that they are not ready yet but we will call her when we get them back.

## 2023-02-28 NOTE — Progress Notes (Signed)
Patient notified and verbalized understanding. 

## 2023-05-24 ENCOUNTER — Ambulatory Visit (INDEPENDENT_AMBULATORY_CARE_PROVIDER_SITE_OTHER): Payer: BC Managed Care – PPO | Admitting: Nurse Practitioner

## 2023-05-24 ENCOUNTER — Encounter: Payer: Self-pay | Admitting: Nurse Practitioner

## 2023-05-24 VITALS — BP 168/92 | HR 68 | Temp 98.2°F | Ht 63.0 in | Wt 144.2 lb

## 2023-05-24 DIAGNOSIS — K56699 Other intestinal obstruction unspecified as to partial versus complete obstruction: Secondary | ICD-10-CM

## 2023-05-24 DIAGNOSIS — M810 Age-related osteoporosis without current pathological fracture: Secondary | ICD-10-CM

## 2023-05-24 DIAGNOSIS — E039 Hypothyroidism, unspecified: Secondary | ICD-10-CM

## 2023-05-24 DIAGNOSIS — R030 Elevated blood-pressure reading, without diagnosis of hypertension: Secondary | ICD-10-CM | POA: Insufficient documentation

## 2023-05-24 DIAGNOSIS — Z0000 Encounter for general adult medical examination without abnormal findings: Secondary | ICD-10-CM | POA: Diagnosis not present

## 2023-05-24 DIAGNOSIS — Z0001 Encounter for general adult medical examination with abnormal findings: Secondary | ICD-10-CM | POA: Insufficient documentation

## 2023-05-24 NOTE — Assessment & Plan Note (Signed)
She has had a DEXA scan this year and reports intolerance to osteoporosis medications in the past. We encourage adequate intake of calcium and vitamin D and plan to repeat the DEXA scan in 2 years.

## 2023-05-24 NOTE — Assessment & Plan Note (Signed)
Elevated blood pressure was noted at today's visit x 2. She reports inconsistent hydration and high salt intake, with no history of cardiovascular disease. No prior history of being treated for hypertension. We will advise her to monitor her blood pressure daily at home. She is encouraged to increase water intake, decrease salt intake, and maintain regular exercise. We plan to review home blood pressure readings via MyChart in 2 weeks. If consistently elevated, we will consider initiating antihypertensive medication. Return precautions given to patient.

## 2023-05-24 NOTE — Assessment & Plan Note (Addendum)
Physical exam complete. Lab work from September reviewed, do not feel it is necessary to recheck lab work today. She is up to date on mammogram and colonoscopy. Pap smear no longer indicated due to hysterectomy. She declined the flu vaccine today. She has received 2 doses of the COVID vaccine and declines additional. We recommend the Shingles vaccine and up dating her Tetanus vaccine, she will get these at her local pharmacy. Counseled on tobacco cessation. Recommended regular dental and eye exams. Encouraged to work on [employer] and exercise. Return to care in 6 months, sooner as needed.

## 2023-05-24 NOTE — Assessment & Plan Note (Signed)
She is stable on Armour Thyroid 90 mg daily, with the last TSH in September within normal limits and no symptoms of hypo- or hyperthyroidism. We will continue Armour Thyroid and plan to recheck TSH in the future.

## 2023-05-24 NOTE — Assessment & Plan Note (Signed)
She has a history of colon stricture and surgeries, leading to altered bowel habits and occasional hemorrhoidal bleeding, with no recent follow-up with gastroenterology. We recommend follow-up with gastroenterology for evaluation of hemorrhoids and bowel habit changes.

## 2023-05-24 NOTE — Progress Notes (Signed)
Bethanie Dicker, NP-C Phone: [phone]  [name] is a 62 y.o. female who presents today to establish care.   Discussed the use of AI scribe software for clinical note transcription with the patient, who gave verbal consent to proceed.  History of Present Illness        The patient, with a history of hypothyroidism, migraines, and osteoporosis, presents to establish care. She has a significant surgical history, starting in 2019, involving multiple gastrointestinal procedures, including an emergency hysterectomy due to organ adhesions and a period of wearing an ostomy bag for 16 months. The patient reports no current gastrointestinal issues, but experiences occasional loose stools and hemorrhoids, which sometimes bleed. She expresses a desire to follow up with a gastroenterologist for a check-up.  The patient's thyroid condition is managed with Armour Thyroid, and her last thyroid check in September showed normal levels. She also takes omeprazole as needed for acid reflux, which presents as a heavy feeling in the stomach rather than heartburn. The patient denies any abdominal pain or blood in the stool, but notes occasional bleeding from hemorrhoids.  The patient has a history of osteoporosis and has been recommended to take injections or medications for the condition, but has declined due to concerns about side effects.  The patient is a current smoker, consuming approximately half a pack per day. She has attempted to quit in the past and expresses a desire to try again. She denies any alcohol or drug use. The patient's diet is described as not ideal, with a tendency to eat out and consume candy, but her cholesterol and A1C levels from a recent lab work were reported as normal.  The patient has a history of high blood pressure readings during clinic visits, which she attributes to rushing and stress.  The patient has not seen a dentist or eye doctor in several years and acknowledges the  need for these preventative care visits. She reports no issues with mood, anxiety, or depression. The patient's sleep and appetite are reported as normal. She engages in walking for exercise, but less so during the colder, darker months.  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   Diverticulitis 12/02/2017   Malnutrition of moderate degree 12/20/2017   Acute thyroiditis 02/24/2016   Gastric reflux 02/24/2016   Headache 02/24/2016   Joint pain 02/24/2016   Psoriasis 02/24/2016   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   Lipid screening 02/19/2023   Left foot pain 02/19/2023   Tobacco dependence 09/14/2018   Elevated blood pressure reading in office without diagnosis of hypertension 05/24/2023   Encounter for routine adult medical exam with abnormal findings 05/24/2023   Resolved Ambulatory Problems    Diagnosis Date Noted   Loss of weight 10/02/2016   Past Medical History:  Diagnosis Date   Blood transfusion without reported diagnosis    Diverticulosis    GERD (gastroesophageal reflux disease)    Heart murmur    Thyroid disease     Family History  Problem Relation Age of Onset   Heart disease Father  congestive heart failure   Hypothyroidism Sister    Arthritis Sister        RA   Hearing loss Sister    Diabetes Maternal Grandmother    Hypothyroidism Maternal Grandmother    Alzheimer's disease Paternal Grandmother    Hypothyroidism Sister    Stroke Brother     Social History   Socioeconomic History    Marital status: Married    Spouse name: Not on file   Number of children: Not on file   Years of education: Not on file   Highest education level: 12th grade  Occupational History   Not on file  Tobacco Use   Smoking status: Every Day    Current packs/day: 0.00    Average packs/day: 0.3 packs/day for 22.4 years (7.5 ttl pk-yrs)    Types: Cigarettes    Start date: 10/17/2002    Last attempt to quit: 10/16/2017    Years since quitting: 5.6   Smokeless tobacco: Never  Vaping Use   Vaping status: Never Used  Substance and Sexual Activity   Alcohol use: No   Drug use: No   Sexual activity: Not Currently    Partners: Male    Birth control/protection: Post-menopausal  Other Topics Concern   Not on file  Social History Narrative   Not on file   Social Determinants of Health   Financial Resource Strain: Low Risk  (05/17/2023)   Overall Financial Resource Strain (CARDIA)    Difficulty of Paying Living Expenses: Not hard at all  Food Insecurity: No Food Insecurity (05/17/2023)   Hunger Vital Sign    Worried About Running Out of Food in the Last Year: Never true    Ran Out of Food in the Last Year: Never true  Transportation Needs: No Transportation Needs (05/17/2023)   PRAPARE - Administrator, Civil Service (Medical): No    Lack of Transportation (Non-Medical): No  Physical Activity: Unknown (05/17/2023)   Exercise Vital Sign    Days of Exercise per Week: 0 days    Minutes of Exercise per Session: Not on file  Stress: No Stress Concern Present (05/17/2023)   [employer] of Occupational Health - Occupational Stress Questionnaire    Feeling of Stress : Only a little  Social Connections: Moderately Isolated (05/17/2023)   Social Connection and Isolation Panel [NHANES]    Frequency of Communication with Friends and Family: Once a week    Frequency of Social Gatherings with Friends and Family: Once a week    Attends Religious Services: 1 to 4 times per year     Active Member of [employer] or Organizations: No    Attends Engineer, structural: Not on file    Marital Status: Married  Catering manager Violence: Not on file    ROS  General:  Negative for unexplained weight loss, fever Skin: Negative for new or changing mole, sore that won't heal HEENT: Negative for trouble hearing, trouble seeing, ringing in ears, mouth sores, hoarseness, change in voice, dysphagia. CV:  Negative for chest pain, dyspnea, edema, palpitations Resp: Negative for cough, dyspnea, hemoptysis GI: Negative for nausea, vomiting, diarrhea, constipation, abdominal pain, melena, hematochezia. GU: Negative for dysuria, incontinence, urinary hesitance, hematuria, vaginal or penile discharge, polyuria, sexual difficulty, lumps in testicle or breasts MSK: Negative for muscle cramps or aches, joint pain or swelling Neuro: Negative for headaches, weakness, numbness, dizziness, passing out/fainting Psych: Negative for depression, anxiety, memory problems  Objective  Physical Exam Vitals:   05/24/23 1523  05/24/23 1610  BP: (!) 150/90 (!) 168/92  Pulse: 68   Temp: 98.2 F (36.8 C)   SpO2: 99%     BP Readings from Last 3 Encounters:  05/24/23 (!) 168/92  02/19/23 (!) 154/90  08/19/18 122/68   Wt Readings from Last 3 Encounters:  05/24/23 144 lb 3.2 oz (65.4 kg)  02/19/23 142 lb (64.4 kg)  08/19/18 140 lb (63.5 kg)    Physical Exam Constitutional:      General: She is not in acute distress.    Appearance: Normal appearance.  HENT:     Head: Normocephalic.     Right Ear: Tympanic membrane normal.     Left Ear: Tympanic membrane normal.     Nose: Nose normal.     Mouth/Throat:     Mouth: Mucous membranes are moist.     Pharynx: Oropharynx is clear.  Eyes:     Conjunctiva/sclera: Conjunctivae normal.     Pupils: Pupils are equal, round, and reactive to light.  Neck:     Thyroid: No thyromegaly.  Cardiovascular:     Rate and Rhythm: Normal rate and regular  rhythm.     Heart sounds: Normal heart sounds.  Pulmonary:     Effort: Pulmonary effort is normal.     Breath sounds: Normal breath sounds.  Abdominal:     General: Abdomen is flat. Bowel sounds are normal.     Palpations: Abdomen is soft. There is no mass.     Tenderness: There is no abdominal tenderness.  Musculoskeletal:        General: Normal range of motion.  Lymphadenopathy:     Cervical: No cervical adenopathy.  Skin:    General: Skin is warm and dry.     Findings: No rash.  Neurological:     General: No focal deficit present.     Mental Status: She is alert.  Psychiatric:        Mood and Affect: Mood normal.        Behavior: Behavior normal.    Assessment/Plan:   Encounter for routine adult medical exam with abnormal findings Assessment & Plan: Physical exam complete. Lab work from September reviewed, do not feel it is necessary to recheck lab work today. She is up to date on mammogram and colonoscopy. Pap smear no longer indicated due to hysterectomy. She declined the flu vaccine today. She has received 2 doses of the COVID vaccine and declines additional. We recommend the Shingles vaccine and up dating her Tetanus vaccine, she will get these at her local pharmacy. Counseled on tobacco cessation. Recommended regular dental and eye exams. Encouraged to work on [employer] and exercise. Return to care in 6 months, sooner as needed.    Elevated blood pressure reading in office without diagnosis of hypertension Assessment & Plan: Elevated blood pressure was noted at today's visit x 2. She reports inconsistent hydration and high salt intake, with no history of cardiovascular disease. No prior history of being treated for hypertension. We will advise her to monitor her blood pressure daily at home. She is encouraged to increase water intake, decrease salt intake, and maintain regular exercise. We plan to review home blood pressure readings via MyChart in 2 weeks. If consistently  elevated, we will consider initiating antihypertensive medication. Return precautions given to patient.    Stricture of sigmoid colon [facility]) Assessment & Plan: She has a history of colon stricture and surgeries, leading to altered bowel habits and occasional hemorrhoidal bleeding, with no recent follow-up  with gastroenterology. We recommend follow-up with gastroenterology for evaluation of hemorrhoids and bowel habit changes.    Acquired hypothyroidism Assessment & Plan: She is stable on Armour Thyroid 90 mg daily, with the last TSH in September within normal limits and no symptoms of hypo- or hyperthyroidism. We will continue Armour Thyroid and plan to recheck TSH in the future.    Osteoporosis, unspecified osteoporosis type, unspecified pathological fracture presence Assessment & Plan: She has had a DEXA scan this year and reports intolerance to osteoporosis medications in the past. We encourage adequate intake of calcium and vitamin D and plan to repeat the DEXA scan in 2 years.     Return in about 6 months (around 11/22/2023) for Follow up, sooner as needed.   Bethanie Dicker, NP-C [facility] - [facility]

## 2023-06-03 ENCOUNTER — Ambulatory Visit: Payer: BC Managed Care – PPO | Admitting: Family Medicine

## 2023-06-04 ENCOUNTER — Other Ambulatory Visit: Payer: Self-pay | Admitting: Nurse Practitioner

## 2023-06-04 ENCOUNTER — Encounter: Payer: Self-pay | Admitting: Nurse Practitioner

## 2023-06-04 DIAGNOSIS — I10 Essential (primary) hypertension: Secondary | ICD-10-CM

## 2023-06-04 HISTORY — DX: Essential (primary) hypertension: I10

## 2023-06-04 MED ORDER — VALSARTAN 80 MG PO TABS
80.0000 mg | ORAL_TABLET | Freq: Every day | ORAL | 0 refills | Status: DC
Start: 2023-06-04 — End: 2023-08-04

## 2023-06-04 NOTE — Progress Notes (Signed)
Pt informed and scheduled

## 2023-06-21 ENCOUNTER — Ambulatory Visit (INDEPENDENT_AMBULATORY_CARE_PROVIDER_SITE_OTHER): Payer: 59

## 2023-06-21 ENCOUNTER — Ambulatory Visit: Payer: BC Managed Care – PPO | Admitting: Cardiology

## 2023-06-21 VITALS — BP 148/92 | HR 80

## 2023-06-21 DIAGNOSIS — I10 Essential (primary) hypertension: Secondary | ICD-10-CM

## 2023-06-21 NOTE — Progress Notes (Signed)
 Patient here for nurse visit BP check per order from [facility].   Patient reports compliance with prescribed BP medications: yes  Last dose of BP medication:   BP Readings from Last 3 Encounters:  06/21/23 (!) 148/92  05/24/23 (!) 168/92  02/19/23 (!) 154/90   Pulse Readings from Last 3 Encounters:  06/21/23 80  05/24/23 68  02/19/23 73      Patient verbalized understanding of instructions.   Greely Atiyeh, CMA

## 2023-06-22 LAB — BASIC METABOLIC PANEL
BUN/Creatinine Ratio: 19 (ref 12–28)
BUN: 13 mg/dL (ref 8–27)
CO2: 24 mmol/L (ref 20–29)
Calcium: 9 mg/dL (ref 8.7–10.3)
Chloride: 104 mmol/L (ref 96–106)
Creatinine, Ser: 0.68 mg/dL (ref 0.57–1.00)
Glucose: 78 mg/dL (ref 70–99)
Potassium: 4.3 mmol/L (ref 3.5–5.2)
Sodium: 142 mmol/L (ref 134–144)
eGFR: 98 mL/min/{1.73_m2} (ref >59)

## 2023-07-05 ENCOUNTER — Ambulatory Visit: Payer: 59

## 2023-07-05 NOTE — Progress Notes (Signed)
Patient here for nurse visit BP check per order from [facility].   Patient reports compliance with prescribed BP medications: yes  Last dose of BP medication: /07/05/23  BP Readings from Last 3 Encounters:  07/05/23 (!) 150/82  06/21/23 (!) 148/92  05/24/23 (!) 168/92   Pulse Readings from Last 3 Encounters:  07/05/23 78  06/21/23 80  05/24/23 68       Jailee Jaquez  [place], CMA

## 2023-08-04 ENCOUNTER — Other Ambulatory Visit: Payer: Self-pay | Admitting: Nurse Practitioner

## 2023-08-04 DIAGNOSIS — I10 Essential (primary) hypertension: Secondary | ICD-10-CM

## 2023-08-05 MED ORDER — VALSARTAN 80 MG PO TABS: 80.0000 mg | ORAL_TABLET | Freq: Two times a day (BID) | ORAL | 3 refills | Status: DC

## 2023-11-29 ENCOUNTER — Ambulatory Visit: Payer: BC Managed Care – PPO | Admitting: Nurse Practitioner

## 2024-01-10 ENCOUNTER — Ambulatory Visit: Payer: Self-pay | Admitting: Nurse Practitioner

## 2024-01-10 ENCOUNTER — Ambulatory Visit: Admitting: Nurse Practitioner

## 2024-01-10 VITALS — BP 134/82 | HR 64 | Temp 98.0°F | Ht 63.0 in | Wt 141.6 lb

## 2024-01-10 DIAGNOSIS — Z13220 Encounter for screening for lipoid disorders: Secondary | ICD-10-CM

## 2024-01-10 DIAGNOSIS — M810 Age-related osteoporosis without current pathological fracture: Secondary | ICD-10-CM

## 2024-01-10 DIAGNOSIS — E039 Hypothyroidism, unspecified: Secondary | ICD-10-CM | POA: Diagnosis not present

## 2024-01-10 DIAGNOSIS — I10 Essential (primary) hypertension: Secondary | ICD-10-CM

## 2024-01-10 LAB — COMPREHENSIVE METABOLIC PANEL WITH GFR
ALT: 7 U/L (ref 0–35)
AST: 8 U/L (ref 0–37)
Albumin: 3.8 g/dL (ref 3.5–5.2)
Alkaline Phosphatase: 87 U/L (ref 39–117)
BUN: 14 mg/dL (ref 6–23)
CO2: 28 meq/L (ref 19–32)
Calcium: 8.8 mg/dL (ref 8.4–10.5)
Chloride: 103 meq/L (ref 96–112)
Creatinine, Ser: 0.67 mg/dL (ref 0.40–1.20)
GFR: 92.5 mL/min (ref >60.00)
Glucose, Bld: 88 mg/dL (ref 70–99)
Potassium: 4.3 meq/L (ref 3.5–5.1)
Sodium: 140 meq/L (ref 135–145)
Total Bilirubin: 0.6 mg/dL (ref 0.2–1.2)
Total Protein: 6.5 g/dL (ref 6.0–8.3)

## 2024-01-10 LAB — LIPID PANEL
Cholesterol: 148 mg/dL (ref 0–200)
HDL: 54.4 mg/dL (ref >39.00)
LDL Cholesterol: 84 mg/dL (ref 0–99)
NonHDL: 94.08
Total CHOL/HDL Ratio: 3
Triglycerides: 52 mg/dL (ref 0.0–149.0)
VLDL: 10.4 mg/dL (ref 0.0–40.0)

## 2024-01-10 LAB — VITAMIN D 25 HYDROXY (VIT D DEFICIENCY, FRACTURES): VITD: 29.31 ng/mL — ABNORMAL LOW (ref 30.00–100.00)

## 2024-01-10 MED ORDER — VALSARTAN 160 MG PO TABS: 160.0000 mg | ORAL_TABLET | Freq: Every day | ORAL | Status: DC

## 2024-01-10 MED ORDER — VALSARTAN 160 MG PO TABS: 160.0000 mg | ORAL_TABLET | Freq: Every day | ORAL | 3 refills | Status: AC

## 2024-01-10 NOTE — Progress Notes (Signed)
 Casey Glance, NP-C Phone: [phone]  [name] is a 63 y.o. female who presents today for follow up.   Discussed the use of AI scribe software for clinical note transcription with the patient, who gave verbal consent to proceed.  History of Present Illness   [name] is a 63 year old female with hypertension and thyroid  issues who presents for a follow-up visit.  She has a history of thyroid  issues and is currently managed on Armour Thyroid , taking a full tablet five days a week and half a tablet two days a week. No issues with heart palpitations, skin, hair, nails, or temperature regulation are reported. Her last endocrinology visit was in May, with stable TSH levels.  She has a long history of gastrointestinal issues but has not experienced any recent problems. She takes omeprazole  or Prilosec  as needed. Her last surgery was performed by Dr. Linnie in [place], with advice to return if issues arise.  Hypertension is managed with valsartan . Occasionally, she misses the morning dose due to her work schedule. No chest pain, shortness of breath, dizziness, or leg swelling is reported. Blood pressure readings at home have varied, with a recent reading of 134/82 in the office and 120 at another location.  She takes several supplements, including calcium, vitamin D3, CoQ10, and B12.  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 She leaves home early in the morning to accommodate her work schedule.  She has a history of osteoporosis and is not currently taking osteoporosis medication due to side effects experienced with previous treatments.      Social History   Tobacco Use  Smoking Status Every Day   Current packs/day: 0.00   Average packs/day: 0.3 packs/day for 22.4 years (7.5 ttl pk-yrs)   Types: Cigarettes   Start date: 10/17/2002   Last attempt to quit:  10/16/2017   Years since quitting: 6.2  Smokeless Tobacco Never    Current Outpatient Medications on File Prior to Visit  Medication Sig Dispense Refill   acetaminophen  (TYLENOL ) 325 MG tablet Take 500 mg by mouth every 6 (six) hours as needed.     ARMOUR THYROID  90 MG tablet Take 90 mg by mouth daily.  3   Ascorbic Acid (VITAMIN C) 1000 MG tablet Take 1,000 mg by mouth at bedtime.     calcium carbonate (CALCIUM 600) 600 MG TABS tablet Take 600 mg by mouth.     Cholecalciferol (VITAMIN D3) 2000 units TABS Take 2,000 Units by mouth 2 (two) times daily. With lunch & at bedtime.     Coenzyme Q10 10 MG capsule Take 10 mg by mouth daily.     MAGnesium -Oxide 400 (240 Mg) MG tablet Take 400 mg by mouth daily.     omeprazole  (PRILOSEC  OTC) 20 MG tablet Take 20 mg by mouth daily as needed (for acid reflux.).      No current facility-administered medications on file prior to visit.     ROS see history of present illness  Objective  Physical Exam Vitals:   01/10/24 0808  BP: 134/82  Pulse: 64  Temp: 98 F (36.7 C)  SpO2: 99%    BP Readings from Last 3 Encounters:  01/10/24 134/82  07/05/23 (!) 150/82  06/21/23 (!) 148/92   Wt Readings from Last 3 Encounters:  01/10/24 141 lb 9.6 oz (64.2 kg)  05/24/23 144 lb 3.2 oz (65.4 kg)  02/19/23  142 lb (64.4 kg)    Physical Exam Constitutional:      General: She is not in acute distress.    Appearance: Normal appearance.  HENT:     Head: Normocephalic.  Cardiovascular:     Rate and Rhythm: Normal rate and regular rhythm.     Heart sounds: Normal heart sounds.  Pulmonary:     Effort: Pulmonary effort is normal.     Breath sounds: Normal breath sounds.  Skin:    General: Skin is warm and dry.  Neurological:     General: No focal deficit present.     Mental Status: She is alert.  Psychiatric:        Mood and Affect: Mood normal.        Behavior: Behavior normal.      Assessment/Plan: Please see individual problem  list.  Primary hypertension Assessment & Plan: Blood pressure is slightly elevated at 134/82 mmHg. She is advised to adjust valsartan  intake for better compliance by taking two tablets simultaneously in the morning. We will send in new Rx. Encourage home monitoring. Check CMP today.   Orders: -     Comprehensive metabolic panel with GFR -     Valsartan ; Take 1 tablet (160 mg total) by mouth daily.  Dispense: 90 tablet; Refill: 3  Acquired hypothyroidism Assessment & Plan: Her condition is well-managed on Armour Thyroid  with normal TSH levels. There is a discussion about a potential endocrinology referral due to dissatisfaction. Continue the current Armour Thyroid  regimen and consider an endocrinology referral if needed.    Osteoporosis, unspecified osteoporosis type, unspecified pathological fracture presence Assessment & Plan: She declined injections or infusions and is taking calcium and vitamin D . She is interested in monitoring with a DEXA scan. Order a DEXA scan to assess osteoporosis progression. Continue vitamin D  and calcium supplementation. Encourage weight bearing exercises.   Orders: -     DG Bone Density; Future -     VITAMIN D  25 Hydroxy (Vit-D Deficiency, Fractures)  Lipid screening -     Lipid panel    Return in about 6 months (around 07/12/2024) for Annual Exam, sooner as needed.   Casey Glance, NP-C [facility]

## 2024-01-21 ENCOUNTER — Encounter: Payer: Self-pay | Admitting: Nurse Practitioner

## 2024-01-21 NOTE — Assessment & Plan Note (Addendum)
 She declined injections or infusions and is taking calcium and vitamin D . She is interested in monitoring with a DEXA scan. Order a DEXA scan to assess osteoporosis progression. Continue vitamin D  and calcium supplementation. Encourage weight bearing exercises.

## 2024-01-21 NOTE — Assessment & Plan Note (Signed)
 Her condition is well-managed on Armour Thyroid  with normal TSH levels. There is a discussion about a potential endocrinology referral due to dissatisfaction. Continue the current Armour Thyroid  regimen and consider an endocrinology referral if needed.

## 2024-01-21 NOTE — Assessment & Plan Note (Signed)
 Blood pressure is slightly elevated at 134/82 mmHg. She is advised to adjust valsartan  intake for better compliance by taking two tablets simultaneously in the morning. We will send in new Rx. Encourage home monitoring. Check CMP today.

## 2024-07-13 ENCOUNTER — Telehealth: Payer: Self-pay | Admitting: Nurse Practitioner

## 2024-07-14 ENCOUNTER — Ambulatory Visit: Payer: Self-pay | Admitting: Nurse Practitioner

## 2024-07-14 ENCOUNTER — Ambulatory Visit: Admitting: Nurse Practitioner

## 2024-07-14 VITALS — BP 150/86 | HR 65 | Temp 98.2°F | Ht 63.0 in | Wt 143.6 lb

## 2024-07-14 DIAGNOSIS — Z13220 Encounter for screening for lipoid disorders: Secondary | ICD-10-CM | POA: Diagnosis not present

## 2024-07-14 DIAGNOSIS — F17200 Nicotine dependence, unspecified, uncomplicated: Secondary | ICD-10-CM | POA: Diagnosis not present

## 2024-07-14 DIAGNOSIS — M810 Age-related osteoporosis without current pathological fracture: Secondary | ICD-10-CM

## 2024-07-14 DIAGNOSIS — I10 Essential (primary) hypertension: Secondary | ICD-10-CM

## 2024-07-14 DIAGNOSIS — Z0000 Encounter for general adult medical examination without abnormal findings: Secondary | ICD-10-CM

## 2024-07-14 DIAGNOSIS — E039 Hypothyroidism, unspecified: Secondary | ICD-10-CM | POA: Diagnosis not present

## 2024-07-14 LAB — LIPID PANEL
Cholesterol: 143 mg/dL (ref 28–200)
HDL: 44.6 mg/dL
LDL Cholesterol: 83 mg/dL (ref 10–99)
NonHDL: 97.95
Total CHOL/HDL Ratio: 3
Triglycerides: 75 mg/dL (ref 10.0–149.0)
VLDL: 15 mg/dL (ref 0.0–40.0)

## 2024-07-14 LAB — COMPREHENSIVE METABOLIC PANEL WITH GFR
ALT: 7 U/L (ref 3–35)
AST: 9 U/L (ref 5–37)
Albumin: 3.6 g/dL (ref 3.5–5.2)
Alkaline Phosphatase: 85 U/L (ref 39–117)
BUN: 11 mg/dL (ref 6–23)
CO2: 28 meq/L (ref 19–32)
Calcium: 8.9 mg/dL (ref 8.4–10.5)
Chloride: 101 meq/L (ref 96–112)
Creatinine, Ser: 0.68 mg/dL (ref 0.40–1.20)
GFR: 91.84 mL/min
Glucose, Bld: 84 mg/dL (ref 70–99)
Potassium: 4 meq/L (ref 3.5–5.1)
Sodium: 138 meq/L (ref 135–145)
Total Bilirubin: 0.5 mg/dL (ref 0.2–1.2)
Total Protein: 6.5 g/dL (ref 6.0–8.3)

## 2024-07-14 LAB — VITAMIN D 25 HYDROXY (VIT D DEFICIENCY, FRACTURES): VITD: 37.94 ng/mL (ref 30.00–100.00)

## 2024-07-14 LAB — TSH: TSH: 2.4 u[IU]/mL (ref 0.35–5.50)

## 2024-07-14 NOTE — Telephone Encounter (Signed)
 error

## 2024-07-14 NOTE — Progress Notes (Unsigned)
 " Casey Glance, NP-C Phone: [phone]  [name] is a 64 y.o. female who presents today for annual exam.   Discussed the use of AI scribe software for clinical note transcription with the patient, who gave verbal consent to proceed.  History of Present Illness   [name] is a 64 year old female who presents for an annual physical exam.  She has a history of hypertension and is currently taking valsartan , 160 mg, divided into two doses taken morning and night. Her recent blood pressure readings were 137/87 at home and 158/86 in the office. No chest pain, shortness of breath, or dizziness.  She continues to see endocrinology for her thyroid  condition, with her last visit in May and annual follow-ups scheduled.  She has received the COVID vaccine but does not get the flu, shingles, or pneumonia vaccines. She is uncertain about her last tetanus shot but believes it may have been over ten years ago.  She smokes less than half a pack of cigarettes a day, especially when at work from 6:30 AM to 6:00 PM. She does not consume alcohol or use drugs. She works at an [employer] and has been there for 46 years. Her diet is not special, and she tries to eat at home more often due to the cold weather. She is active at work due to a broken buzzer requiring her to move frequently.  Her sleep is generally good, although she sometimes wakes up after a few hours and takes a while to fall back asleep. No issues with mood, anxiety, or depression. No abdominal pain, urinary symptoms, headaches, dizziness, trouble swallowing, skin changes, or joint pains. She recently visited a dermatologist and reports no swelling in her legs.      Tobacco Use History[1]  Medications Ordered Prior to Encounter[2]   ROS see history of present illness  Objective  Physical Exam Vitals:   07/14/24 1009 07/14/24 1041  BP: (!) 158/86 (!) 150/86  Pulse: 65   Temp: 98.2 F (36.8 C)   SpO2:  96%     BP Readings from Last 3 Encounters:  07/14/24 (!) 150/86  01/10/24 134/82  07/05/23 (!) 150/82   Wt Readings from Last 3 Encounters:  07/14/24 143 lb 9.6 oz (65.1 kg)  01/10/24 141 lb 9.6 oz (64.2 kg)  05/24/23 144 lb 3.2 oz (65.4 kg)    Physical Exam Constitutional:      General: She is not in acute distress.    Appearance: Normal appearance.  HENT:     Head: Normocephalic.     Right Ear: Tympanic membrane normal.     Left Ear: Tympanic membrane normal.     Nose: Nose normal.     Mouth/Throat:     Mouth: Mucous membranes are moist.     Pharynx: Oropharynx is clear.  Eyes:     Conjunctiva/sclera: Conjunctivae normal.     Pupils: Pupils are equal, round, and reactive to light.  Neck:     Thyroid : No thyromegaly.  Cardiovascular:     Rate and Rhythm: Normal rate and regular rhythm.     Heart sounds: Normal heart sounds.  Pulmonary:     Effort: Pulmonary effort is normal.     Breath sounds: Normal breath sounds.  Abdominal:     General: Abdomen is flat. Bowel sounds are normal.     Palpations: Abdomen is soft. There is no mass.     Tenderness: There is no abdominal tenderness.  Musculoskeletal:  General: Normal range of motion.  Lymphadenopathy:     Cervical: No cervical adenopathy.  Skin:    General: Skin is warm and dry.     Findings: No rash.  Neurological:     General: No focal deficit present.     Mental Status: She is alert.  Psychiatric:        Mood and Affect: Mood normal.        Behavior: Behavior normal.      Assessment/Plan: Please see individual problem list.  Routine general medical examination at a health care facility Assessment & Plan: Physical exam complete. We will check lab work as outlined. There is no family history of breast, colon, or ovarian cancer. She had a mammogram in September and a colonoscopy in 2018. Pap smears no longer indicated, s/p hysterectomy. No recent dental or eye exams. She declines flu, shingles,  pneumonia, and additional COVID vaccines. Tetanus vaccination was updated today. Advise routine dental and eye exams. Encourage healthy diet and regular exercise. Return to care in one year, sooner as needed.   Primary hypertension Assessment & Plan: Blood pressure is elevated with an office reading of 158/86 and a home reading of 137/87. She is currently on valsartan  80 mg BID. The goal is to achieve BP <130/80 mmHg. She is advised to adjust valsartan  intake for better compliance by taking one 160 mg tablet in the morning. We will send in new Rx. Encourage home monitoring, she will contact if remaining consistently elevated. Check CMP today.   Orders: -     Comprehensive metabolic panel with GFR  Acquired hypothyroidism Assessment & Plan: Her condition is well-managed on Armour Thyroid  with normal TSH levels. Continue the current Armour Thyroid  regimen and follow up with Endocrinology as scheduled.   Orders: -     TSH  Osteoporosis, unspecified osteoporosis type, unspecified pathological fracture presence Assessment & Plan: She declined injections or infusions and is taking calcium and vitamin D . She is interested in monitoring. DEXA scan previously ordered, encouraged to schedule. Continue vitamin D  and calcium supplementation. Encourage weight bearing exercises.   Orders: -     VITAMIN D  25 Hydroxy (Vit-D Deficiency, Fractures)  Tobacco dependence Assessment & Plan: Counseled on tobacco cessation. Not interested in quitting at this time.    Lipid screening -     Lipid panel    Return in about 1 year (around 07/14/2025) for Annual Exam, sooner as needed.   Casey Glance, NP-C [facility] [place]     [1]  Social History Tobacco Use  Smoking Status Every Day   Current packs/day: 0.00   Average packs/day: 0.3 packs/day for 22.4 years (7.5 ttl pk-yrs)   Types: Cigarettes   Start date: 10/17/2002   Last attempt to quit: 10/16/2017   Years since quitting:  6.7  Smokeless Tobacco Never  [2]  Current Outpatient Medications on File Prior to Visit  Medication Sig Dispense Refill   acetaminophen  (TYLENOL ) 325 MG tablet Take 500 mg by mouth every 6 (six) hours as needed.     ARMOUR THYROID  90 MG tablet Take 90 mg by mouth daily.  3   Ascorbic Acid (VITAMIN C) 1000 MG tablet Take 1,000 mg by mouth at bedtime.     calcium carbonate (CALCIUM 600) 600 MG TABS tablet Take 600 mg by mouth.     Cholecalciferol (VITAMIN D3) 2000 units TABS Take 2,000 Units by mouth 2 (two) times daily. With lunch & at bedtime.     Coenzyme  Q10 10 MG capsule Take 10 mg by mouth daily.     MAGnesium -Oxide 400 (240 Mg) MG tablet Take 400 mg by mouth daily.     omeprazole  (PRILOSEC  OTC) 20 MG tablet Take 20 mg by mouth daily as needed (for acid reflux.).      valsartan  (DIOVAN ) 160 MG tablet Take 1 tablet (160 mg total) by mouth daily. 90 tablet 3   No current facility-administered medications on file prior to visit.   "

## 2024-07-23 ENCOUNTER — Encounter: Payer: Self-pay | Admitting: Nurse Practitioner

## 2024-07-23 NOTE — Assessment & Plan Note (Signed)
 Her condition is well-managed on Armour Thyroid  with normal TSH levels. Continue the current Armour Thyroid  regimen and follow up with Endocrinology as scheduled.

## 2024-07-23 NOTE — Assessment & Plan Note (Signed)
 Counseled on tobacco cessation. Not interested in quitting at this time.

## 2024-07-23 NOTE — Assessment & Plan Note (Addendum)
 She declined injections or infusions and is taking calcium and vitamin D . She is interested in monitoring. DEXA scan previously ordered, encouraged to schedule. Continue vitamin D  and calcium supplementation. Encourage weight bearing exercises.

## 2024-07-23 NOTE — Assessment & Plan Note (Signed)
 Blood pressure is elevated with an office reading of 158/86 and a home reading of 137/87. She is currently on valsartan  80 mg BID. The goal is to achieve BP <130/80 mmHg. She is advised to adjust valsartan  intake for better compliance by taking one 160 mg tablet in the morning. We will send in new Rx. Encourage home monitoring, she will contact if remaining consistently elevated. Check CMP today.

## 2024-07-23 NOTE — Assessment & Plan Note (Signed)
 Physical exam complete. We will check lab work as outlined. There is no family history of breast, colon, or ovarian cancer. She had a mammogram in September and a colonoscopy in 2018. Pap smears no longer indicated, s/p hysterectomy. No recent dental or eye exams. She declines flu, shingles, pneumonia, and additional COVID vaccines. Tetanus vaccination was updated today. Advise routine dental and eye exams. Encourage healthy diet and regular exercise. Return to care in one year, sooner as needed.

## 2024-07-24 ENCOUNTER — Telehealth: Payer: Self-pay

## 2024-07-24 NOTE — Telephone Encounter (Signed)
 Verbal order needed in order to give pt TDAP vax, at time of CPE vax's were not available pt is on [place] schedule on 07-27-24

## 2024-07-27 ENCOUNTER — Ambulatory Visit

## 2025-07-15 ENCOUNTER — Encounter: Admitting: Nurse Practitioner
